# Patient Record
Sex: Female | Born: 1945 | Race: White | Hispanic: No | Marital: Married | Smoking: Never smoker
Health system: Southern US, Community
[De-identification: ages and names within clinical notes are randomized; demographics above are authoritative.]

## PROBLEM LIST (undated history)

## (undated) DIAGNOSIS — I447 Left bundle-branch block, unspecified: Secondary | ICD-10-CM

## (undated) DIAGNOSIS — C801 Malignant (primary) neoplasm, unspecified: Secondary | ICD-10-CM

## (undated) DIAGNOSIS — M1990 Unspecified osteoarthritis, unspecified site: Secondary | ICD-10-CM

## (undated) DIAGNOSIS — E785 Hyperlipidemia, unspecified: Secondary | ICD-10-CM

## (undated) DIAGNOSIS — I10 Essential (primary) hypertension: Secondary | ICD-10-CM

## (undated) DIAGNOSIS — C50919 Malignant neoplasm of unspecified site of unspecified female breast: Secondary | ICD-10-CM

## (undated) DIAGNOSIS — M109 Gout, unspecified: Secondary | ICD-10-CM

## (undated) DIAGNOSIS — Z923 Personal history of irradiation: Secondary | ICD-10-CM

## (undated) DIAGNOSIS — M810 Age-related osteoporosis without current pathological fracture: Secondary | ICD-10-CM

## (undated) DIAGNOSIS — Z853 Personal history of malignant neoplasm of breast: Secondary | ICD-10-CM

## (undated) DIAGNOSIS — K5790 Diverticulosis of intestine, part unspecified, without perforation or abscess without bleeding: Secondary | ICD-10-CM

## (undated) DIAGNOSIS — K219 Gastro-esophageal reflux disease without esophagitis: Secondary | ICD-10-CM

## (undated) DIAGNOSIS — N189 Chronic kidney disease, unspecified: Secondary | ICD-10-CM

## (undated) HISTORY — DX: Gastro-esophageal reflux disease without esophagitis: K21.9

## (undated) HISTORY — DX: Essential (primary) hypertension: I10

## (undated) HISTORY — DX: Chronic kidney disease, unspecified: N18.9

## (undated) HISTORY — DX: Gout, unspecified: M10.9

## (undated) HISTORY — DX: Age-related osteoporosis without current pathological fracture: M81.0

## (undated) HISTORY — DX: Unspecified osteoarthritis, unspecified site: M19.90

## (undated) HISTORY — DX: Hyperlipidemia, unspecified: E78.5

## (undated) HISTORY — DX: Left bundle-branch block, unspecified: I44.7

## (undated) HISTORY — DX: Malignant (primary) neoplasm, unspecified: C80.1

## (undated) HISTORY — DX: Personal history of malignant neoplasm of breast: Z85.3

## (undated) HISTORY — DX: Diverticulosis of intestine, part unspecified, without perforation or abscess without bleeding: K57.90

---

## 1994-04-22 DIAGNOSIS — C801 Malignant (primary) neoplasm, unspecified: Secondary | ICD-10-CM

## 1994-04-22 DIAGNOSIS — Z923 Personal history of irradiation: Secondary | ICD-10-CM

## 1994-04-22 DIAGNOSIS — C50919 Malignant neoplasm of unspecified site of unspecified female breast: Secondary | ICD-10-CM

## 1994-04-22 HISTORY — PX: BREAST LUMPECTOMY: SHX2

## 1994-04-22 HISTORY — DX: Malignant neoplasm of unspecified site of unspecified female breast: C50.919

## 1994-04-22 HISTORY — DX: Malignant (primary) neoplasm, unspecified: C80.1

## 1994-04-22 HISTORY — DX: Personal history of irradiation: Z92.3

## 1997-06-28 ENCOUNTER — Ambulatory Visit (HOSPITAL_COMMUNITY): Admission: RE | Admit: 1997-06-28 | Discharge: 1997-06-28 | Payer: Self-pay | Admitting: Hematology and Oncology

## 1997-10-12 ENCOUNTER — Ambulatory Visit (HOSPITAL_COMMUNITY): Admission: RE | Admit: 1997-10-12 | Discharge: 1997-10-12 | Payer: Self-pay | Admitting: Urology

## 1998-08-22 ENCOUNTER — Ambulatory Visit (HOSPITAL_COMMUNITY): Admission: RE | Admit: 1998-08-22 | Discharge: 1998-08-22 | Payer: Self-pay | Admitting: Hematology and Oncology

## 1998-08-22 ENCOUNTER — Encounter: Payer: Self-pay | Admitting: Hematology and Oncology

## 1998-08-28 ENCOUNTER — Encounter (HOSPITAL_BASED_OUTPATIENT_CLINIC_OR_DEPARTMENT_OTHER): Payer: Self-pay | Admitting: General Surgery

## 1998-08-28 ENCOUNTER — Ambulatory Visit (HOSPITAL_COMMUNITY): Admission: RE | Admit: 1998-08-28 | Discharge: 1998-08-28 | Payer: Self-pay | Admitting: General Surgery

## 1999-02-27 ENCOUNTER — Ambulatory Visit (HOSPITAL_COMMUNITY): Admission: RE | Admit: 1999-02-27 | Discharge: 1999-02-27 | Payer: Self-pay | Admitting: Obstetrics and Gynecology

## 1999-02-27 ENCOUNTER — Encounter: Payer: Self-pay | Admitting: Obstetrics and Gynecology

## 1999-05-01 ENCOUNTER — Other Ambulatory Visit: Admission: RE | Admit: 1999-05-01 | Discharge: 1999-05-01 | Payer: Self-pay | Admitting: Obstetrics and Gynecology

## 1999-10-03 ENCOUNTER — Encounter: Payer: Self-pay | Admitting: Oncology

## 1999-10-03 ENCOUNTER — Encounter: Admission: RE | Admit: 1999-10-03 | Discharge: 1999-10-03 | Payer: Self-pay | Admitting: Oncology

## 2000-04-23 ENCOUNTER — Encounter: Admission: RE | Admit: 2000-04-23 | Discharge: 2000-04-23 | Payer: Self-pay | Admitting: Oncology

## 2000-04-23 ENCOUNTER — Encounter: Payer: Self-pay | Admitting: Oncology

## 2000-06-18 ENCOUNTER — Other Ambulatory Visit: Admission: RE | Admit: 2000-06-18 | Discharge: 2000-06-18 | Payer: Self-pay | Admitting: Obstetrics and Gynecology

## 2000-10-08 ENCOUNTER — Encounter: Payer: Self-pay | Admitting: Oncology

## 2000-10-08 ENCOUNTER — Encounter: Admission: RE | Admit: 2000-10-08 | Discharge: 2000-10-08 | Payer: Self-pay | Admitting: Oncology

## 2001-10-06 ENCOUNTER — Encounter: Admission: RE | Admit: 2001-10-06 | Discharge: 2001-10-06 | Payer: Self-pay | Admitting: Oncology

## 2001-10-06 ENCOUNTER — Encounter: Payer: Self-pay | Admitting: Oncology

## 2001-10-09 ENCOUNTER — Encounter: Admission: RE | Admit: 2001-10-09 | Discharge: 2001-10-09 | Payer: Self-pay | Admitting: Oncology

## 2001-10-09 ENCOUNTER — Encounter: Payer: Self-pay | Admitting: Oncology

## 2002-10-26 ENCOUNTER — Encounter: Admission: RE | Admit: 2002-10-26 | Discharge: 2002-10-26 | Payer: Self-pay | Admitting: Oncology

## 2002-10-26 ENCOUNTER — Encounter: Payer: Self-pay | Admitting: Oncology

## 2003-06-27 ENCOUNTER — Other Ambulatory Visit: Admission: RE | Admit: 2003-06-27 | Discharge: 2003-06-27 | Payer: Self-pay | Admitting: Obstetrics and Gynecology

## 2003-10-31 ENCOUNTER — Encounter: Payer: Self-pay | Admitting: Internal Medicine

## 2003-10-31 ENCOUNTER — Encounter: Admission: RE | Admit: 2003-10-31 | Discharge: 2003-10-31 | Payer: Self-pay | Admitting: Obstetrics and Gynecology

## 2003-11-16 ENCOUNTER — Ambulatory Visit (HOSPITAL_COMMUNITY): Admission: RE | Admit: 2003-11-16 | Discharge: 2003-11-16 | Payer: Self-pay | Admitting: Oncology

## 2004-03-06 ENCOUNTER — Ambulatory Visit: Payer: Self-pay | Admitting: Internal Medicine

## 2004-08-20 ENCOUNTER — Other Ambulatory Visit: Admission: RE | Admit: 2004-08-20 | Discharge: 2004-08-20 | Payer: Self-pay | Admitting: *Deleted

## 2004-09-04 ENCOUNTER — Ambulatory Visit: Payer: Self-pay | Admitting: Internal Medicine

## 2004-11-05 ENCOUNTER — Encounter: Admission: RE | Admit: 2004-11-05 | Discharge: 2004-11-05 | Payer: Self-pay | Admitting: Obstetrics and Gynecology

## 2005-02-26 ENCOUNTER — Ambulatory Visit: Payer: Self-pay | Admitting: Internal Medicine

## 2005-03-11 ENCOUNTER — Ambulatory Visit: Payer: Self-pay | Admitting: Internal Medicine

## 2005-04-22 HISTORY — PX: COLONOSCOPY: SHX174

## 2005-05-13 ENCOUNTER — Ambulatory Visit: Payer: Self-pay | Admitting: Gastroenterology

## 2005-05-28 ENCOUNTER — Ambulatory Visit: Payer: Self-pay | Admitting: Gastroenterology

## 2005-05-28 ENCOUNTER — Encounter: Payer: Self-pay | Admitting: Internal Medicine

## 2005-06-10 ENCOUNTER — Ambulatory Visit: Payer: Self-pay | Admitting: Internal Medicine

## 2005-06-17 ENCOUNTER — Ambulatory Visit: Payer: Self-pay | Admitting: Internal Medicine

## 2005-07-30 ENCOUNTER — Ambulatory Visit: Payer: Self-pay | Admitting: Internal Medicine

## 2005-11-12 ENCOUNTER — Encounter: Admission: RE | Admit: 2005-11-12 | Discharge: 2005-11-12 | Payer: Self-pay | Admitting: Obstetrics and Gynecology

## 2005-11-26 LAB — CONVERTED CEMR LAB
Cholesterol: 303 mg/dL
HDL: 68 mg/dL
LDL Cholesterol: 176 mg/dL
Triglycerides: 294 mg/dL
VLDL: 59 mg/dL

## 2005-11-29 ENCOUNTER — Other Ambulatory Visit: Admission: RE | Admit: 2005-11-29 | Discharge: 2005-11-29 | Payer: Self-pay | Admitting: Obstetrics and Gynecology

## 2005-12-17 ENCOUNTER — Ambulatory Visit: Payer: Self-pay | Admitting: Internal Medicine

## 2006-02-18 ENCOUNTER — Ambulatory Visit: Payer: Self-pay | Admitting: Internal Medicine

## 2006-02-25 ENCOUNTER — Ambulatory Visit: Payer: Self-pay | Admitting: Internal Medicine

## 2006-04-08 ENCOUNTER — Ambulatory Visit: Payer: Self-pay | Admitting: Internal Medicine

## 2006-04-29 ENCOUNTER — Ambulatory Visit: Payer: Self-pay | Admitting: Internal Medicine

## 2006-10-21 ENCOUNTER — Ambulatory Visit: Payer: Self-pay | Admitting: Internal Medicine

## 2006-10-28 ENCOUNTER — Ambulatory Visit: Payer: Self-pay | Admitting: Internal Medicine

## 2006-10-31 DIAGNOSIS — I10 Essential (primary) hypertension: Secondary | ICD-10-CM

## 2006-10-31 DIAGNOSIS — Z853 Personal history of malignant neoplasm of breast: Secondary | ICD-10-CM

## 2006-10-31 DIAGNOSIS — M810 Age-related osteoporosis without current pathological fracture: Secondary | ICD-10-CM

## 2006-10-31 DIAGNOSIS — K219 Gastro-esophageal reflux disease without esophagitis: Secondary | ICD-10-CM

## 2006-10-31 DIAGNOSIS — M8580 Other specified disorders of bone density and structure, unspecified site: Secondary | ICD-10-CM

## 2006-10-31 HISTORY — DX: Gastro-esophageal reflux disease without esophagitis: K21.9

## 2006-10-31 HISTORY — DX: Personal history of malignant neoplasm of breast: Z85.3

## 2006-10-31 HISTORY — DX: Essential (primary) hypertension: I10

## 2006-10-31 HISTORY — DX: Age-related osteoporosis without current pathological fracture: M81.0

## 2006-11-03 ENCOUNTER — Ambulatory Visit: Payer: Self-pay

## 2006-11-03 ENCOUNTER — Encounter: Payer: Self-pay | Admitting: Internal Medicine

## 2006-11-10 DIAGNOSIS — E785 Hyperlipidemia, unspecified: Secondary | ICD-10-CM | POA: Insufficient documentation

## 2006-12-02 ENCOUNTER — Ambulatory Visit: Payer: Self-pay | Admitting: Internal Medicine

## 2007-02-03 ENCOUNTER — Other Ambulatory Visit: Admission: RE | Admit: 2007-02-03 | Discharge: 2007-02-03 | Payer: Self-pay | Admitting: Obstetrics & Gynecology

## 2007-02-24 ENCOUNTER — Encounter: Admission: RE | Admit: 2007-02-24 | Discharge: 2007-02-24 | Payer: Self-pay | Admitting: Obstetrics and Gynecology

## 2007-02-24 ENCOUNTER — Encounter: Payer: Self-pay | Admitting: Internal Medicine

## 2007-03-03 ENCOUNTER — Ambulatory Visit: Payer: Self-pay | Admitting: Internal Medicine

## 2007-05-26 ENCOUNTER — Ambulatory Visit: Payer: Self-pay | Admitting: Internal Medicine

## 2007-05-26 LAB — CONVERTED CEMR LAB
ALT: 38 units/L
Triglycerides: 169 mg/dL

## 2007-06-02 ENCOUNTER — Ambulatory Visit: Payer: Self-pay | Admitting: Internal Medicine

## 2007-12-29 ENCOUNTER — Ambulatory Visit: Payer: Self-pay | Admitting: Internal Medicine

## 2007-12-29 LAB — HM COLONOSCOPY

## 2008-04-22 LAB — CONVERTED CEMR LAB: Pap Smear: NORMAL

## 2008-04-26 ENCOUNTER — Encounter: Admission: RE | Admit: 2008-04-26 | Discharge: 2008-04-26 | Payer: Self-pay | Admitting: Obstetrics and Gynecology

## 2008-05-10 ENCOUNTER — Other Ambulatory Visit: Admission: RE | Admit: 2008-05-10 | Discharge: 2008-05-10 | Payer: Self-pay | Admitting: Obstetrics and Gynecology

## 2008-05-31 ENCOUNTER — Encounter: Payer: Self-pay | Admitting: Internal Medicine

## 2008-06-01 ENCOUNTER — Ambulatory Visit: Payer: Self-pay | Admitting: Internal Medicine

## 2008-07-27 ENCOUNTER — Ambulatory Visit: Payer: Self-pay | Admitting: Internal Medicine

## 2009-01-24 ENCOUNTER — Ambulatory Visit: Payer: Self-pay | Admitting: Internal Medicine

## 2009-04-20 ENCOUNTER — Telehealth: Payer: Self-pay | Admitting: Internal Medicine

## 2009-04-27 ENCOUNTER — Encounter: Admission: RE | Admit: 2009-04-27 | Discharge: 2009-04-27 | Payer: Self-pay | Admitting: Internal Medicine

## 2009-04-27 LAB — HM MAMMOGRAPHY: HM Mammogram: NEGATIVE

## 2009-07-25 ENCOUNTER — Ambulatory Visit: Payer: Self-pay | Admitting: Internal Medicine

## 2009-07-25 DIAGNOSIS — I447 Left bundle-branch block, unspecified: Secondary | ICD-10-CM | POA: Insufficient documentation

## 2009-08-15 ENCOUNTER — Encounter (INDEPENDENT_AMBULATORY_CARE_PROVIDER_SITE_OTHER): Payer: Self-pay | Admitting: *Deleted

## 2009-08-15 ENCOUNTER — Ambulatory Visit: Payer: Self-pay | Admitting: Cardiology

## 2009-08-15 ENCOUNTER — Ambulatory Visit: Payer: Self-pay

## 2009-08-15 ENCOUNTER — Ambulatory Visit (HOSPITAL_COMMUNITY): Admission: RE | Admit: 2009-08-15 | Discharge: 2009-08-15 | Payer: Self-pay | Admitting: Internal Medicine

## 2009-08-15 ENCOUNTER — Encounter: Payer: Self-pay | Admitting: Internal Medicine

## 2010-04-30 ENCOUNTER — Encounter
Admission: RE | Admit: 2010-04-30 | Discharge: 2010-04-30 | Payer: Self-pay | Source: Home / Self Care | Attending: Obstetrics and Gynecology | Admitting: Obstetrics and Gynecology

## 2010-05-22 ENCOUNTER — Ambulatory Visit
Admission: RE | Admit: 2010-05-22 | Discharge: 2010-05-22 | Payer: Self-pay | Source: Home / Self Care | Attending: Internal Medicine | Admitting: Internal Medicine

## 2010-05-24 NOTE — Assessment & Plan Note (Signed)
Summary: 6 month rov/njr   Vital Signs:  Patient profile:   65 year old female Weight:      149 pounds Temp:     97.8 degrees F oral Pulse rate:   68 / minute Pulse rhythm:   regular Resp:     12 per minute BP sitting:   124 / 70  (left arm) Cuff size:   regular  Vitals Entered By: Gladis Riffle, RN (July 25, 2009 8:04 AM) CC: 6 month rov--discuss chest discomfort Is Patient Diabetic? No   CC:  6 month rov--discuss chest discomfort.  History of Present Illness:  Follow-Up Visit      This is a 65 year old woman who presents for Follow-up visit.  The patient complains of chest pain, but denies palpitations, syncope, SOB, DOE, and PND.  The patient reports taking meds as prescribed and not monitoring BP.  When questioned about possible medication side effects, the patient notes none.   In regards to chest pain it happened for at least one week but pain was constant--now resolved. Pian radiated to midback. She was able to find one area in back that was sensitive to palpation  All other systems reviewed and were negative except some rhinorrhea, PNDr ongoing for 2 weeks  Preventive Screening-Counseling & Management  Alcohol-Tobacco     Smoking Status: never  Current Problems (verified): 1)  Transaminases, Serum, Elevated  (ICD-790.4) 2)  Hyperlipidemia  (ICD-272.4) 3)  Osteoporosis  (ICD-733.00) 4)  Hypertension  (ICD-401.9) 5)  Gerd  (ICD-530.81) 6)  Breast Cancer, Hx of  (ICD-V10.3)  Current Medications (verified): 1)  Hydrochlorothiazide 25 Mg Tabs (Hydrochlorothiazide) .... Take 1 Tablet By Mouth Every Morning 2)  Lisinopril 40 Mg Tabs (Lisinopril) .... Take 1 Tablet By Mouth Once A Day 3)  Caltrate 600+d 600-400 Mg-Unit  Tabs (Calcium Carbonate-Vitamin D) .... 3 Once Daily 4)  Omeprazole 20 Mg Cpdr (Omeprazole) .... One By Mouth Daily 5)  Premarin 0.625 Mg/gm Crea (Estrogens, Conjugated) .... Use Every Other Day As Directed 6)  Vitamin D (Ergocalciferol) 50000 Unit Caps  (Ergocalciferol) .... One Every Other Week 7)  Vitamin B-12 500 Mcg Tabs (Cyanocobalamin) .... Once Daily  Allergies: 1)  ! Simvastatin (Simvastatin) 2)  ! * Shrimp  Past History:  Past Medical History: Last updated: 12/29/2007 Breast cancer, hx of--1996 GERD Hypertension Osteoporosis Elevated Lipids Elevated LFT's Hyperlipidemia Headache  Past Surgical History: Last updated: 12/29/2007 Lumpectomy---followed by raditation therapy and tamoxifen for 5 years.  Colonoscopy-05/28/2005  Family History: Last updated: 06/02/2007 Family History of Arthritis Family History Breast cancer 1st degree relative <50 Family History Diabetes 1st degree relative Family History Hypertension Family History Kidney disease Family History of Cardiovascular disorder-mother in her 70s--stroke  Social History: Last updated: 12/01/2006 Never Smoked Married Alcohol use-no Drug use-no Regular exercise-no  Risk Factors: Exercise: no (12/01/2006)  Risk Factors: Smoking Status: never (07/25/2009)  Physical Exam  General:  Well-developed,well-nourished,in no acute distress; alert,appropriate and cooperative throughout examination Head:  normocephalic and atraumatic.   Eyes:  pupils equal and pupils round.   Ears:  R ear normal and L ear normal.   Neck:  No deformities, masses, or tenderness noted. Chest Wall:  No deformities, masses, or tenderness noted. Lungs:  normal respiratory effort, no intercostal retractions, and no accessory muscle use.   Heart:  normal rate and regular rhythm.   Abdomen:  Bowel sounds positive,abdomen soft and non-tender without masses, organomegaly or hernias noted. Msk:  No deformity or scoliosis noted of thoracic  or lumbar spine.   Pulses:  R radial normal and L radial normal.   Neurologic:  cranial nerves II-XII intact and gait normal.   Skin:  turgor normal and color normal.   Psych:  normally interactive and good eye contact.     Impression &  Recommendations:  Problem # 1:  HYPERLIPIDEMIA (ICD-272.4) intolerant to statins note HDL Labs Reviewed: SGOT: 28 (05/26/2007)   SGPT: 38 (05/26/2007)   HDL:74 (05/26/2007), 68 (11/26/2005)  LDL:157 (05/26/2007), 176 (11/26/2005)  Chol:265 (05/26/2007), 303 (11/26/2005)  Trig:169 (05/26/2007), 294 (11/26/2005)  Problem # 2:  HYPERTENSION (ICD-401.9) controlled continue current medications  Her updated medication list for this problem includes:    Hydrochlorothiazide 25 Mg Tabs (Hydrochlorothiazide) .Marland Kitchen... Take 1 tablet by mouth every morning    Lisinopril 40 Mg Tabs (Lisinopril) .Marland Kitchen... Take 1 tablet by mouth once a day  BP today: 124/70 Prior BP: 144/78 (01/24/2009)  Labs Reviewed: Chol: 265 (05/26/2007)   HDL: 74 (05/26/2007)   LDL: 157 (05/26/2007)   TG: 169 (05/26/2007)  Problem # 3:  GERD (ICD-530.81) i suspect this was related to chest pain Her updated medication list for this problem includes:    Omeprazole 20 Mg Cpdr (Omeprazole) ..... 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 Take 1 tablet by mouth every morning 2)  Lisinopril 40 Mg Tabs (Lisinopril) .... Take 1 tablet by mouth once a day 3)  Caltrate 600+d 600-400 Mg-unit Tabs (Calcium carbonate-vitamin d) .... 3 once daily 4)  Omeprazole 20 Mg Cpdr (Omeprazole) .... One by mouth daily 5)  Premarin 0.625 Mg/gm Crea (Estrogens, conjugated) .... Use every other day as directed 6)  Vitamin D (ergocalciferol) 50000 Unit Caps (Ergocalciferol) .... One every other week 7)  Vitamin B-12 500 Mcg Tabs (Cyanocobalamin) .... Once daily  Appended Document: 6 month rov/njr take ASA daily

## 2010-05-24 NOTE — Letter (Signed)
Summary: Outpatient Coinsurance Notice   Outpatient Coinsurance Notice   Imported By: Marylou Mccoy 08/18/2009 15:59:53  _____________________________________________________________________  External Attachment:    Type:   Image     Comment:   External Document

## 2010-05-30 NOTE — Assessment & Plan Note (Signed)
Summary: fup/med check/refills/cjr   Vital Signs:  Patient profile:   65 year old female Weight:      150 pounds Temp:     98.4 degrees F oral Pulse rate:   60 / minute Pulse rhythm:   regular BP sitting:   142 / 78  (left arm) Cuff size:   regular  Vitals Entered By: Alfred Levins, CMA (May 22, 2010 12:11 PM)  History of Present Illness:  Follow-Up Visit  htn---tolerating meds well, needs Rx All other systems reviewed and were negative except some rhinorrhea, PNDr ongoing for 2 weeks  Current Medications (verified): 1)  Hydrochlorothiazide 25 Mg Tabs (Hydrochlorothiazide) .... Take 1 Tablet By Mouth Every Morning 2)  Lisinopril 40 Mg Tabs (Lisinopril) .... Take 1 Tablet By Mouth Once A Day 3)  Caltrate 600+d 600-400 Mg-Unit  Tabs (Calcium Carbonate-Vitamin D) .... 3 Once Daily 4)  Omeprazole 20 Mg Cpdr (Omeprazole) .... One By Mouth Daily 5)  Premarin 0.625 Mg/gm Crea (Estrogens, Conjugated) .... Use Every Other Day As Directed 6)  Vitamin D (Ergocalciferol) 50000 Unit Caps (Ergocalciferol) .... One Every Other Week  Allergies (verified): 1)  ! Simvastatin (Simvastatin) 2)  ! * Shrimp  Past History:  Past Medical History: Last updated: 12/29/2007 Breast cancer, hx of--1996 GERD Hypertension Osteoporosis Elevated Lipids Elevated LFT's Hyperlipidemia Headache  Past Surgical History: Last updated: 12/29/2007 Lumpectomy---followed by raditation therapy and tamoxifen for 5 years.  Colonoscopy-05/28/2005  Family History: Last updated: 06/02/2007 Family History of Arthritis Family History Breast cancer 1st degree relative <50 Family History Diabetes 1st degree relative Family History Hypertension Family History Kidney disease Family History of Cardiovascular disorder-mother in her 70s--stroke  Social History: Last updated: 12/01/2006 Never Smoked Married Alcohol use-no Drug use-no Regular exercise-no  Risk Factors: Exercise: no (12/01/2006)  Risk  Factors: Smoking Status: never (07/25/2009)  Physical Exam  General:  alert and well-developed.   Head:  normocephalic and atraumatic.   Neck:  No deformities, masses, or tenderness noted. Chest Wall:  No deformities, masses, or tenderness noted. Abdomen:  Bowel sounds positive,abdomen soft and non-tender without masses, organomegaly or hernias noted.   Impression & Recommendations:  Problem # 1:  HYPERTENSION (ICD-401.9) adequately controlled at home Her updated medication list for this problem includes:    Hydrochlorothiazide 25 Mg Tabs (Hydrochlorothiazide) .Marland Kitchen... Take 1 tablet by mouth every morning    Lisinopril 40 Mg Tabs (Lisinopril) .Marland Kitchen... Take 1 tablet by mouth once a day  BP today: 142/78 Prior BP: 124/70 (07/25/2009)  Labs Reviewed: Chol: 265 (05/26/2007)   HDL: 74 (05/26/2007)   LDL: 157 (05/26/2007)   TG: 169 (05/26/2007)  Complete Medication List: 1)  Hydrochlorothiazide 25 Mg Tabs (Hydrochlorothiazide) .... Take 1 tablet by mouth every morning 2)  Lisinopril 40 Mg Tabs (Lisinopril) .... Take 1 tablet by mouth once a day 3)  Caltrate 600+d 600-400 Mg-unit Tabs (Calcium carbonate-vitamin d) .... 3 once daily 4)  Omeprazole 20 Mg Cpdr (Omeprazole) .... One by mouth daily 5)  Premarin 0.625 Mg/gm Crea (Estrogens, conjugated) .... Use every other day as directed 6)  Vitamin D (ergocalciferol) 50000 Unit Caps (Ergocalciferol) .... One every other week  Patient Instructions: 1)  4 months---wellness exam Prescriptions: OMEPRAZOLE 20 MG CPDR (OMEPRAZOLE) one by mouth daily  #90 Each x 3   Entered and Authorized by:   Birdie Sons MD   Signed by:   Birdie Sons MD on 05/22/2010   Method used:   Faxed to ...       [facility])       [address] [address]       [place], [place]  [address]       Ph: [phone]       Fax: [phone]   RxID:   [id number] LISINOPRIL 40 MG TABS (LISINOPRIL) Take 1 tablet by mouth once a day  #90 x 3   Entered and Authorized by:    Birdie Sons MD   Signed by:   Birdie Sons MD on 05/22/2010   Method used:   Faxed to ...       [facility])       [address] [address]       [place], [place]  [address]       Ph: [phone]       Fax: [phone]   RxID:   [id number] HYDROCHLOROTHIAZIDE 25 MG TABS (HYDROCHLOROTHIAZIDE) Take 1 tablet by mouth every morning  #90 x 3   Entered and Authorized by:   Birdie Sons MD   Signed by:   Birdie Sons MD on 05/22/2010   Method used:   Faxed to ...       [facility])       [address] [address]       [place], [place]  [address]       Ph: [phone]       Fax: [phone]   RxID:   [id number]    Orders Added: 1)  Est. Patient Level III [71696]

## 2010-06-06 ENCOUNTER — Encounter: Payer: Self-pay | Admitting: Family Medicine

## 2010-06-06 ENCOUNTER — Ambulatory Visit (INDEPENDENT_AMBULATORY_CARE_PROVIDER_SITE_OTHER): Payer: Medicare Other | Admitting: Family Medicine

## 2010-06-06 VITALS — BP 160/92 | Temp 98.5°F | Ht 62.0 in | Wt 147.0 lb

## 2010-06-06 DIAGNOSIS — J209 Acute bronchitis, unspecified: Secondary | ICD-10-CM

## 2010-06-06 MED ORDER — AZITHROMYCIN 250 MG PO TABS: ORAL_TABLET | ORAL | Status: AC

## 2010-06-06 NOTE — Patient Instructions (Signed)
Consider mucinex 2 tablets twice daily for cough.

## 2010-06-06 NOTE — Progress Notes (Signed)
  Subjective:    Patient ID: Michele Conley, female    DOB: Nov 12, 1945, 65 y.o.   MRN: [id number]  HPI  Patient seen with onset of illness approximately 2 weeks ago. Presents mostly with cough productive of green to yellow sputum. Low-grade fever up to 100.6 recently but none over the past 48 hours. Date over-the-counter Robitussin cough syrup with little improvement. The patient is nonsmoker. No antibiotic allergies.   Denies any sore throat, nausea, vomiting, or diarrhea. Minimal nasal congestion   Review of Systems  as per history of present illness    Objective:   Physical Exam  the patient is alert and in no distress Oropharynx is moist and clear Eardrums normal Neck supple no adenopathy Chest clear to auscultation Heart regular rhythm and rate       Assessment & Plan:   acute bronchitis. Given duration of symptoms start Zithromax. Try over-the-counter Mucinex 2 tablets twice daily

## 2010-09-07 ENCOUNTER — Encounter: Payer: Self-pay | Admitting: Internal Medicine

## 2010-09-07 ENCOUNTER — Ambulatory Visit (INDEPENDENT_AMBULATORY_CARE_PROVIDER_SITE_OTHER): Payer: Medicare Other | Admitting: Internal Medicine

## 2010-09-07 DIAGNOSIS — E785 Hyperlipidemia, unspecified: Secondary | ICD-10-CM

## 2010-09-07 DIAGNOSIS — Z23 Encounter for immunization: Secondary | ICD-10-CM

## 2010-09-07 DIAGNOSIS — I447 Left bundle-branch block, unspecified: Secondary | ICD-10-CM

## 2010-09-07 DIAGNOSIS — Z0000 Encounter for general adult medical examination without abnormal findings: Secondary | ICD-10-CM

## 2010-09-07 DIAGNOSIS — I10 Essential (primary) hypertension: Secondary | ICD-10-CM

## 2010-09-07 DIAGNOSIS — Z853 Personal history of malignant neoplasm of breast: Secondary | ICD-10-CM

## 2010-09-07 LAB — BASIC METABOLIC PANEL
BUN: 24 mg/dL — ABNORMAL HIGH (ref 6–23)
Chloride: 104 mEq/L (ref 96–112)
Glucose, Bld: 79 mg/dL (ref 70–99)
Potassium: 5 mEq/L (ref 3.5–5.1)
Sodium: 141 mEq/L (ref 135–145)

## 2010-09-07 LAB — LIPID PANEL
Cholesterol: 202 mg/dL — ABNORMAL HIGH (ref 0–200)
Total CHOL/HDL Ratio: 4
VLDL: 53 mg/dL — ABNORMAL HIGH (ref 0.0–40.0)

## 2010-09-07 LAB — CBC WITH DIFFERENTIAL/PLATELET
Eosinophils Absolute: 0.1 10*3/uL (ref 0.0–0.7)
MCHC: 34.2 g/dL (ref 30.0–36.0)
MCV: 89.8 fl (ref 78.0–100.0)
Monocytes Absolute: 0.9 10*3/uL (ref 0.1–1.0)
Neutrophils Relative %: 60.7 % (ref 43.0–77.0)
Platelets: 330 10*3/uL (ref 150.0–400.0)

## 2010-09-07 LAB — HEPATIC FUNCTION PANEL
ALT: 24 U/L (ref 0–35)
AST: 19 U/L (ref 0–37)
Albumin: 3.9 g/dL (ref 3.5–5.2)
Alkaline Phosphatase: 110 U/L (ref 39–117)
Total Bilirubin: 0.5 mg/dL (ref 0.3–1.2)

## 2010-09-07 LAB — LDL CHOLESTEROL, DIRECT: Direct LDL: 126.6 mg/dL

## 2010-09-07 NOTE — Progress Notes (Deleted)
  Subjective:    Patient ID: Michele Conley, female    DOB: [birth year], 65 y.o.   MRN: [id number]  HPI    Review of Systems     Objective:   Physical Exam        Assessment & Plan:

## 2010-09-07 NOTE — Progress Notes (Signed)
  Subjective:    Michele Conley is a 65 y.o. female who presents for a welcome to Medicare exam.  In addition this patient's medical issues are addressed during this visit. See assessment and plan. Cardiac risk factors: advanced age (older than 39 for men, 7 for women).  Activities of Daily Living  In your present state of health, do you have any difficulty performing the following activities?:  Preparing food and eating?: No Bathing yourself: No Getting dressed: No Using the toilet:No Moving around from place to place: No In the past year have you fallen or had a near fall?:No  Current exercise habits: Gym/ health club routine includes cardio.   Dietary issues discussed: healthy diet   Depression Screen (Note: if answer to either of the following is "Yes", then a more complete depression screening is indicated)  Q1: Over the past two weeks, have you felt down, depressed or hopeless?no   Past Medical History  Diagnosis Date  . HYPERTENSION 10/31/2006  . GERD 10/31/2006  . OSTEOPOROSIS 10/31/2006  . BREAST CANCER, HX OF 10/31/2006   Past Surgical History  Procedure Date  . Breast lumpectomy     followed by radiation and tamoxifen X 5 years    reports that she has never smoked. She does not have any smokeless tobacco history on file. Her alcohol and drug histories not on file. family history includes Arthritis in her other; Cancer in her other; Diabetes in her other; Heart disease in her mother; Hypertension in her other; Kidney disease in her other; and Stroke in her mother. Allergies  Allergen Reactions  . Simvastatin     REACTION: nausea    Review of Systems  patient denies chest pain, shortness of breath, orthopnea. Denies lower extremity edema, abdominal pain, change in appetite, change in bowel movements. Patient denies rashes, musculoskeletal complaints. No other specific complaints in a complete review of systems.     Objective:      Blood pressure 130/80,  pulse 64, temperature 97.9 F (36.6 C), temperature source Oral, height 5' 1.75" (1.568 m), weight 141 lb (63.957 kg). Body mass index is 26.00 kg/(m^2).  Well-developed well-nourished female in no acute distress. HEENT exam atraumatic, normocephalic, extraocular muscles are intact. Neck is supple. No jugular venous distention no thyromegaly. Chest clear to auscultation without increased work of breathing. Cardiac exam S1 and S2 are regular. Abdominal exam active bowel sounds, soft, nontender. Extremities no edema. Neurologic exam she is alert without any motor sensory deficits. Gait is normal.   Assessment:    well visit Health maint utd She will check with insurance about shingles vaccine     Plan:     During the course of the visit the patient was educated and counseled about appropriate screening and preventive services including:   Pneumococcal vaccine   Influenza vaccine  Td vaccine

## 2010-09-07 NOTE — Patient Instructions (Signed)
Low-Cholesterol Guidelines   WHAT IS CHOLESTEROL?   Cholesterol is a waxy, fat-like substance found only in animal fats. It is manufactured by the body and is essential to the functioning of the brain and many other organs. When eaten in excess, cholesterol can raise blood cholesterol levels or help form fatty deposits in blood vessel walls.   WHAT IS SATURATED FAT?   Saturated fats occur naturally in all foods of animal origin and in a few vegetable products such as coconut oil, palm or palm kernel oil, and cocoa butter (chocolate). Hydrogenated (hardened) fats such as vegetable shortening are also highly saturated. They are solid at room temperature and can also have the same harmful effects on your circulatory system as cholesterol.   WHAT ARE UNSATURATED FATS?   Unsaturated fats are fats of plant origin and are liquid at room temperature. Monounsaturated fats such as peanut oil, olive oil, and fish oils tend not to raise blood cholesterol levels and may even be beneficial in lowering them when used in place of saturated fats.   Polyunsaturated fats such as safflower, sunflower, corn, soybean, sesame, and canola oils tend not to raise blood cholesterol when used in moderate amounts.   HOW SHOULD I EAT?   Eat more fish and poultry (with no skin, visible fat, or breading). Eat less red meat.   Buy lean meat and trim all visible fat. Eat no more than 6 ounces (total cooked weight) of any meat, fish, or poultry each day.   Limit intake of fried or fatty foods.   Use low-fat dairy products such as skim milk, low- or non-fat yogurt, and low-fat cheese (try evaporated skim milk in your coffee instead of cream or nondairy creamers).   Eat five or more servings of fruits and vegetables each day. Keep some ready to eat in your refrigerator for a healthy snack.   Use heart healthy tub margarine or oils that are polyunsaturated rather than butter or vegetable shortening.   Avoid excessive use of foods known to be high in  cholesterol, such as egg yolk, liver, shellfish, duck, and goose. Limit egg yolks to no more than 4 per week.   Include complex carbohydrate foods such as whole-grain breads and cereals, beans, rice, and pasta. They contribute needed fiber and are very low in fat.   WHAT ELSE CAN I DO?   Remember to check all labels. Many foods contain hidden fats!   Bake, broil, or roast meat rather than frying them. Skip gravies and rich sauces.   Limit intake of rich pastries and desserts that are high in butter, eggs, cream, or shortening.   Choose a frozen fruit sorbet or low- or non-fat frozen yogurt rather than ice cream. (Some sherbets may contain whole milk, so check labels carefully!)   Use a "nonstick" cooking spray or "nonstick" pans.   Chill and skim fat off of meat stock or drippings when making soup or sauces.   Check labels for the words "hydrogenated" or "hardened" fats or oils, and avoid these products.   ARE THERE OTHER TERMS YOU MAY HAVE QUESTIONS ABOUT?   HDL's (high-density lipoproteins) and LDL's (low-density lipoproteins) transport cholesterol in your blood. LDLs carry cholesterol into your cells and HDLs carry it away and dispose of it in the liver, having a protective effect on your circulatory system. High saturated fat, high cholesterol diets may increase the harmful LDL's. Not smoking, participating in aerobic activity, and losing weight if you are overweight may increase the beneficial   HDL's.   Triglycerides (TG) are fatty compounds. Body fat is made up of mostly stored triglycerides. Triglycerides also circulate in the blood stream like cholesterol, and may have a link with heart disease. To lower triglycerides, maintain ideal body weight, limit intake of sugars and alcohol, and eat a low-fat diet.   Document Released: 09/28/2001 Document Re-Released: 04/28/2007   [employer] Patient Information 2011 [employer]

## 2010-09-18 NOTE — Assessment & Plan Note (Signed)
Adequate control. Continue current medications. 

## 2010-09-18 NOTE — Assessment & Plan Note (Signed)
No known recurrence 

## 2010-09-18 NOTE — Assessment & Plan Note (Signed)
I would like to address lipid issues with diet and exercise. I don't think there is any indication for treatment at this time.

## 2011-03-12 ENCOUNTER — Encounter: Payer: Self-pay | Admitting: Internal Medicine

## 2011-03-12 ENCOUNTER — Ambulatory Visit (INDEPENDENT_AMBULATORY_CARE_PROVIDER_SITE_OTHER): Payer: Medicare Other | Admitting: Internal Medicine

## 2011-03-12 VITALS — BP 126/64 | HR 80 | Temp 98.1°F | Ht 62.0 in | Wt 130.0 lb

## 2011-03-12 DIAGNOSIS — Z853 Personal history of malignant neoplasm of breast: Secondary | ICD-10-CM

## 2011-03-12 DIAGNOSIS — I10 Essential (primary) hypertension: Secondary | ICD-10-CM

## 2011-03-12 DIAGNOSIS — E785 Hyperlipidemia, unspecified: Secondary | ICD-10-CM

## 2011-03-12 DIAGNOSIS — Z23 Encounter for immunization: Secondary | ICD-10-CM

## 2011-03-12 MED ORDER — HYDROCHLOROTHIAZIDE 25 MG PO TABS: 25.0000 mg | ORAL_TABLET | Freq: Every day | ORAL | Status: DC

## 2011-03-12 MED ORDER — LISINOPRIL 40 MG PO TABS: 40.0000 mg | ORAL_TABLET | Freq: Every day | ORAL | Status: DC

## 2011-03-12 MED ORDER — OMEPRAZOLE 20 MG PO CPDR: 20.0000 mg | DELAYED_RELEASE_CAPSULE | Freq: Every day | ORAL | Status: DC

## 2011-03-12 MED ORDER — HYDROCORTISONE 2.5 % RE CREA: TOPICAL_CREAM | RECTAL | Status: AC

## 2011-03-12 NOTE — Progress Notes (Signed)
  Subjective:    Patient ID: Michele Conley, female    DOB: 04-Jan-1946, 65 y.o.   MRN: [id number]  HPI  Patient Active Problem List  Diagnoses  . HYPERLIPIDEMIA---unable to tolerate statin. Has lost 20 pounds  . HYPERTENSION--tolerating meds  .   Marland Kitchen GERD--  No sxs on meds  .   Marland Kitchen BREAST CANCER, HX OF--see overview section   Past Medical History  Diagnosis Date  . HYPERTENSION 10/31/2006  . GERD 10/31/2006  . OSTEOPOROSIS 10/31/2006  . BREAST CANCER, HX OF 10/31/2006   Past Surgical History  Procedure Date  . Breast lumpectomy     followed by radiation and tamoxifen X 5 years    reports that she has never smoked. She does not have any smokeless tobacco history on file. Her alcohol and drug histories not on file. family history includes Arthritis in her other; Breast cancer in her maternal grandmother, mother, and sisters; Cancer in her other; Diabetes in her other; Heart disease in her mother; Hypertension in her other; Kidney disease in her other; and Stroke in her mother. Allergies  Allergen Reactions  . Simvastatin     REACTION: nausea     Review of Systems  patient denies chest pain, shortness of breath, orthopnea. Denies lower extremity edema, abdominal pain, change in appetite, change in bowel movements. Patient denies rashes, musculoskeletal complaints. No other specific complaints in a complete review of systems.      Objective:   Physical Exam  Well-developed well-nourished female in no acute distress. HEENT exam atraumatic, normocephalic, extraocular muscles are intact. Neck is supple. No jugular venous distention no thyromegaly. Chest clear to auscultation without increased work of breathing. Cardiac exam S1 and S2 are regular. Abdominal exam active bowel sounds, soft, nontender. Extremities no edema. Neurologic exam she is alert without any motor sensory deficits. Gait is normal.    Assessment & Plan:

## 2011-03-12 NOTE — Assessment & Plan Note (Signed)
No need for treatment Check in 6 months Note purposeful weight loss

## 2011-03-12 NOTE — Assessment & Plan Note (Signed)
Has yearly mammogram/breast exam

## 2011-03-12 NOTE — Assessment & Plan Note (Signed)
Well controlled Continue meds 

## 2011-04-26 ENCOUNTER — Other Ambulatory Visit: Payer: Self-pay | Admitting: Internal Medicine

## 2011-04-26 DIAGNOSIS — Z1231 Encounter for screening mammogram for malignant neoplasm of breast: Secondary | ICD-10-CM

## 2011-04-26 DIAGNOSIS — Z853 Personal history of malignant neoplasm of breast: Secondary | ICD-10-CM

## 2011-05-13 ENCOUNTER — Ambulatory Visit
Admission: RE | Admit: 2011-05-13 | Discharge: 2011-05-13 | Disposition: A | Discharge status: Home / Self Care | Payer: Medicare Other | Source: Ambulatory Visit | Attending: Internal Medicine | Admitting: Internal Medicine

## 2011-05-13 DIAGNOSIS — Z853 Personal history of malignant neoplasm of breast: Secondary | ICD-10-CM

## 2011-05-13 DIAGNOSIS — Z1231 Encounter for screening mammogram for malignant neoplasm of breast: Secondary | ICD-10-CM

## 2011-05-28 DIAGNOSIS — Z0000 Encounter for general adult medical examination without abnormal findings: Secondary | ICD-10-CM | POA: Diagnosis not present

## 2011-05-28 DIAGNOSIS — Z124 Encounter for screening for malignant neoplasm of cervix: Secondary | ICD-10-CM | POA: Diagnosis not present

## 2011-05-28 DIAGNOSIS — Z01419 Encounter for gynecological examination (general) (routine) without abnormal findings: Secondary | ICD-10-CM | POA: Diagnosis not present

## 2011-05-28 DIAGNOSIS — E559 Vitamin D deficiency, unspecified: Secondary | ICD-10-CM | POA: Diagnosis not present

## 2011-09-10 ENCOUNTER — Ambulatory Visit: Payer: Medicare Other | Admitting: Internal Medicine

## 2011-09-17 ENCOUNTER — Encounter: Payer: Self-pay | Admitting: Internal Medicine

## 2011-09-17 ENCOUNTER — Ambulatory Visit (INDEPENDENT_AMBULATORY_CARE_PROVIDER_SITE_OTHER): Payer: Medicare Other | Admitting: Internal Medicine

## 2011-09-17 VITALS — BP 118/62 | HR 90 | Temp 98.8°F | Ht 62.0 in | Wt 133.0 lb

## 2011-09-17 DIAGNOSIS — E785 Hyperlipidemia, unspecified: Secondary | ICD-10-CM

## 2011-09-17 DIAGNOSIS — K219 Gastro-esophageal reflux disease without esophagitis: Secondary | ICD-10-CM

## 2011-09-17 DIAGNOSIS — I10 Essential (primary) hypertension: Secondary | ICD-10-CM | POA: Diagnosis not present

## 2011-09-17 LAB — LIPID PANEL
HDL: 67.5 mg/dL (ref >39.00)
Total CHOL/HDL Ratio: 4
Triglycerides: 118 mg/dL (ref 0.0–149.0)

## 2011-09-17 LAB — BASIC METABOLIC PANEL
CO2: 29 mEq/L (ref 19–32)
Chloride: 102 mEq/L (ref 96–112)
Creatinine, Ser: 0.9 mg/dL (ref 0.4–1.2)
Glucose, Bld: 95 mg/dL (ref 70–99)
Sodium: 139 mEq/L (ref 135–145)

## 2011-09-17 LAB — LDL CHOLESTEROL, DIRECT: Direct LDL: 154.9 mg/dL

## 2011-09-17 NOTE — Assessment & Plan Note (Signed)
No sxs Continue same meds Check cbc today

## 2011-09-17 NOTE — Assessment & Plan Note (Signed)
No treatment currently Needs f/u

## 2011-09-17 NOTE — Assessment & Plan Note (Signed)
Well controlled Continue same meds 

## 2011-09-17 NOTE — Progress Notes (Signed)
Patient ID: Michele Conley, female   DOB: 05/06/1945, 66 y.o.   MRN: [id number]  htn-- tolerating meds  Lipids-- no treamtne- previous reaction to simvastatin  gerd-- tolerating meds  Past Medical History  Diagnosis Date  . HYPERTENSION 10/31/2006  . GERD 10/31/2006  . OSTEOPOROSIS 10/31/2006  . BREAST CANCER, HX OF 10/31/2006    History   Social History  . Marital Status: Married    Spouse Name: N/A    Number of Children: N/A  . Years of Education: N/A   Occupational History  . Not on file.   Social History Main Topics  . Smoking status: Never Smoker   . Smokeless tobacco: Not on file  . Alcohol Use: Not on file  . Drug Use: Not on file  . Sexually Active: Not on file   Other Topics Concern  . Not on file   Social History Narrative  . No narrative on file    Past Surgical History  Procedure Date  . Breast lumpectomy     followed by radiation and tamoxifen X 5 years    Family History  Problem Relation Age of Onset  . Heart disease Mother   . Stroke Mother     CVA mother age 18  . Breast cancer Mother   . Arthritis Other   . Cancer Other     breast < age 59  . Diabetes Other   . Hypertension Other   . Kidney disease Other   . Breast cancer Sister   . Breast cancer Maternal Grandmother   . Breast cancer Sister     Allergies  Allergen Reactions  . Simvastatin     REACTION: nausea    Current Outpatient Prescriptions on File Prior to Visit  Medication Sig Dispense Refill  . Calcium Carbonate-Vitamin D (CALTRATE 600+D) 600-400 MG-UNIT per tablet Take 1 tablet by mouth daily.        Marland Kitchen conjugated estrogens (PREMARIN) vaginal cream Place 1 g vaginally daily.        . ergocalciferol (VITAMIN D2) 50000 UNITS capsule Take 50,000 Units by mouth every 30 (thirty) days.       . hydrochlorothiazide (HYDRODIURIL) 25 MG tablet Take 1 tablet (25 mg total) by mouth daily.  90 tablet  3  . hydrocortisone (ANUSOL-HC) 2.5 % rectal cream Apply rectally 2 times daily  30  g  1  . lisinopril (PRINIVIL,ZESTRIL) 40 MG tablet Take 1 tablet (40 mg total) by mouth daily.  90 tablet  3  . omeprazole (PRILOSEC) 20 MG capsule Take 1 capsule (20 mg total) by mouth daily.  90 capsule  3     patient denies chest pain, shortness of breath, orthopnea. Denies lower extremity edema, abdominal pain, change in appetite, change in bowel movements. Patient denies rashes, musculoskeletal complaints. No other specific complaints in a complete review of systems.   BP 118/62  Pulse 90  Temp(Src) 98.8 F (37.1 C) (Oral)  Ht 5\' 2"  (1.575 m)  Wt 133 lb (60.328 kg)  BMI 24.33 kg/m2  Well-developed well-nourished female in no acute distress. HEENT exam atraumatic, normocephalic, extraocular muscles are intact. Neck is supple. No jugular venous distention no thyromegaly. Chest clear to auscultation without increased work of breathing. Cardiac exam S1 and S2 are regular. Abdominal exam active bowel sounds, soft, nontender. Extremities no edema. Neurologic exam she is alert without any motor sensory deficits. Gait is normal.

## 2012-02-26 ENCOUNTER — Other Ambulatory Visit: Payer: Self-pay | Admitting: Internal Medicine

## 2012-04-07 ENCOUNTER — Other Ambulatory Visit: Payer: Self-pay | Admitting: Internal Medicine

## 2012-04-07 DIAGNOSIS — Z1231 Encounter for screening mammogram for malignant neoplasm of breast: Secondary | ICD-10-CM

## 2012-05-13 ENCOUNTER — Ambulatory Visit
Admission: RE | Admit: 2012-05-13 | Discharge: 2012-05-13 | Disposition: A | Discharge status: Home / Self Care | Payer: Medicare Other | Source: Ambulatory Visit | Attending: Internal Medicine | Admitting: Internal Medicine

## 2012-05-13 DIAGNOSIS — Z1231 Encounter for screening mammogram for malignant neoplasm of breast: Secondary | ICD-10-CM | POA: Diagnosis not present

## 2012-05-14 DIAGNOSIS — H2510 Age-related nuclear cataract, unspecified eye: Secondary | ICD-10-CM | POA: Diagnosis not present

## 2012-05-29 DIAGNOSIS — E559 Vitamin D deficiency, unspecified: Secondary | ICD-10-CM | POA: Diagnosis not present

## 2012-09-16 ENCOUNTER — Ambulatory Visit: Payer: Medicare Other | Admitting: Internal Medicine

## 2012-10-26 ENCOUNTER — Ambulatory Visit: Payer: Medicare Other | Admitting: Internal Medicine

## 2012-11-16 ENCOUNTER — Encounter: Payer: Self-pay | Admitting: Internal Medicine

## 2012-11-16 ENCOUNTER — Ambulatory Visit (INDEPENDENT_AMBULATORY_CARE_PROVIDER_SITE_OTHER): Payer: Medicare Other | Admitting: Internal Medicine

## 2012-11-16 VITALS — BP 132/64 | HR 64 | Temp 98.0°F | Wt 141.0 lb

## 2012-11-16 DIAGNOSIS — M810 Age-related osteoporosis without current pathological fracture: Secondary | ICD-10-CM | POA: Diagnosis not present

## 2012-11-16 DIAGNOSIS — I10 Essential (primary) hypertension: Secondary | ICD-10-CM

## 2012-11-16 DIAGNOSIS — Z853 Personal history of malignant neoplasm of breast: Secondary | ICD-10-CM | POA: Diagnosis not present

## 2012-11-16 DIAGNOSIS — K219 Gastro-esophageal reflux disease without esophagitis: Secondary | ICD-10-CM | POA: Diagnosis not present

## 2012-11-16 DIAGNOSIS — M542 Cervicalgia: Secondary | ICD-10-CM | POA: Diagnosis not present

## 2012-11-16 DIAGNOSIS — E785 Hyperlipidemia, unspecified: Secondary | ICD-10-CM | POA: Diagnosis not present

## 2012-11-16 LAB — BASIC METABOLIC PANEL
Calcium: 9.5 mg/dL (ref 8.4–10.5)
Creatinine, Ser: 1 mg/dL (ref 0.4–1.2)

## 2012-11-16 LAB — CBC WITH DIFFERENTIAL/PLATELET
Basophils Relative: 0.6 % (ref 0.0–3.0)
Eosinophils Relative: 1.3 % (ref 0.0–5.0)
Hemoglobin: 13.6 g/dL (ref 12.0–15.0)
Lymphocytes Relative: 27.2 % (ref 12.0–46.0)
Monocytes Relative: 8.9 % (ref 3.0–12.0)
Neutrophils Relative %: 62 % (ref 43.0–77.0)
RBC: 4.51 Mil/uL (ref 3.87–5.11)
WBC: 7.4 10*3/uL (ref 4.5–10.5)

## 2012-11-16 LAB — HEPATIC FUNCTION PANEL
AST: 21 U/L (ref 0–37)
Alkaline Phosphatase: 99 U/L (ref 39–117)
Bilirubin, Direct: 0 mg/dL (ref 0.0–0.3)

## 2012-11-16 LAB — SEDIMENTATION RATE: Sed Rate: 75 mm/hr — ABNORMAL HIGH (ref 0–22)

## 2012-11-16 LAB — LDL CHOLESTEROL, DIRECT: Direct LDL: 159.6 mg/dL

## 2012-11-16 LAB — LIPID PANEL
HDL: 52.8 mg/dL (ref >39.00)
VLDL: 70 mg/dL — ABNORMAL HIGH (ref 0.0–40.0)

## 2012-11-16 NOTE — Assessment & Plan Note (Signed)
Well controlled. Continue same medications. Check laboratory work today.

## 2012-11-16 NOTE — Assessment & Plan Note (Signed)
Reviewed last DEXA scan. At that time T-scores put her in the range of osteopenia. That was in 2011. Will recheck DEXA scan.

## 2012-11-16 NOTE — Progress Notes (Signed)
Patient comes in for followup. Patient with gastroesophageal reflux disease tolerating PPI without difficulty.  Patient hypertension, tolerating lisinopril and hydrochlorothiazide  Patient with history of breast cancer Patient had a lumpectomy 1996 followed by radiation and tamoxifen for 5 years.  Patient also has a known left bundle branch block.  Reviewed health maintenance. She is not due for any except for Zostavax which he previously has declined.  Reviewed past medical history, family history, social history, medications.  New complaint- 2months ago- had difficulty lifting both arms. Says it was not painful. But since that time she has had some discomfort of back of neck especially when she tries to lift arms above head.  Review of systems: Patient denies any significant complaints. Specifically no chest pain, shortness breath, PND, orthopnea.  Reviewed vital signs. In general she appears well. Chest clear auscultation cardiac exam S1-S2 are regular. Abdominal exam active bowel sounds, soft, nontender   A/p:- neck discomfort and subjective arm weakness- check esr and ekg F/u after lab

## 2012-11-16 NOTE — Assessment & Plan Note (Signed)
No known recurrence 

## 2012-11-16 NOTE — Assessment & Plan Note (Signed)
No symptoms on PPI. Will check laboratory work today.

## 2012-11-16 NOTE — Assessment & Plan Note (Signed)
Intolerant to statins. 

## 2012-11-17 ENCOUNTER — Ambulatory Visit: Payer: Medicare Other | Admitting: Internal Medicine

## 2012-11-18 ENCOUNTER — Ambulatory Visit (INDEPENDENT_AMBULATORY_CARE_PROVIDER_SITE_OTHER)
Admission: RE | Admit: 2012-11-18 | Discharge: 2012-11-18 | Disposition: A | Discharge status: Home / Self Care | Payer: Medicare Other | Source: Ambulatory Visit | Attending: Internal Medicine | Admitting: Internal Medicine

## 2012-11-18 DIAGNOSIS — M810 Age-related osteoporosis without current pathological fracture: Secondary | ICD-10-CM

## 2012-12-28 ENCOUNTER — Other Ambulatory Visit: Payer: Self-pay | Admitting: Internal Medicine

## 2013-01-26 ENCOUNTER — Other Ambulatory Visit (INDEPENDENT_AMBULATORY_CARE_PROVIDER_SITE_OTHER): Payer: Medicare Other

## 2013-01-26 DIAGNOSIS — E785 Hyperlipidemia, unspecified: Secondary | ICD-10-CM | POA: Diagnosis not present

## 2013-01-26 DIAGNOSIS — M542 Cervicalgia: Secondary | ICD-10-CM | POA: Diagnosis not present

## 2013-01-26 LAB — LIPID PANEL: HDL: 52.1 mg/dL (ref >39.00)

## 2013-01-26 LAB — SEDIMENTATION RATE: Sed Rate: 43 mm/hr — ABNORMAL HIGH (ref 0–22)

## 2013-01-26 LAB — LDL CHOLESTEROL, DIRECT: Direct LDL: 178.5 mg/dL

## 2013-02-25 ENCOUNTER — Other Ambulatory Visit: Payer: Self-pay

## 2013-03-09 ENCOUNTER — Other Ambulatory Visit: Payer: Self-pay | Admitting: Internal Medicine

## 2013-05-17 ENCOUNTER — Ambulatory Visit: Payer: Medicare Other | Admitting: Internal Medicine

## 2013-06-15 ENCOUNTER — Ambulatory Visit: Payer: Medicare Other | Admitting: Internal Medicine

## 2013-06-30 ENCOUNTER — Ambulatory Visit: Payer: Medicare Other | Admitting: Internal Medicine

## 2013-07-07 ENCOUNTER — Ambulatory Visit (INDEPENDENT_AMBULATORY_CARE_PROVIDER_SITE_OTHER): Payer: Medicare Other | Admitting: Internal Medicine

## 2013-07-07 ENCOUNTER — Encounter: Payer: Self-pay | Admitting: Internal Medicine

## 2013-07-07 VITALS — BP 122/62 | HR 64 | Temp 97.9°F | Ht 62.0 in | Wt 138.0 lb

## 2013-07-07 DIAGNOSIS — I10 Essential (primary) hypertension: Secondary | ICD-10-CM

## 2013-07-07 NOTE — Progress Notes (Signed)
Pre visit review using our clinic review tool, if applicable. No additional management support is needed unless otherwise documented below in the visit note. 

## 2013-07-07 NOTE — Progress Notes (Signed)
htn- tolerating meds  GERD- no sxs  Reviewed pmh, psh, sochx Reviewed meds Ros: no complaints  BP 122/62  Pulse 64  Temp(Src) 97.9 F (36.6 C) (Oral)  Ht 5\' 2"  (1.575 m)  Wt 138 lb (62.596 kg)  BMI 25.23 kg/m2  Well-developed well-nourished female in no acute distress. HEENT exam atraumatic, normocephalic, extraocular muscles are intact. Neck is supple. No jugular venous distention no thyromegaly. Chest clear to auscultation without increased work of breathing. Cardiac exam S1 and S2 are regular. Abdominal exam active bowel sounds, soft, nontender. Extremities no edema. Neurologic exam she is alert without any motor sensory deficits. Gait is normal.

## 2013-07-12 NOTE — Assessment & Plan Note (Signed)
Well controlled. Continue current meds

## 2013-07-31 ENCOUNTER — Other Ambulatory Visit: Payer: Self-pay | Admitting: Internal Medicine

## 2013-10-05 ENCOUNTER — Telehealth: Payer: Self-pay | Admitting: Internal Medicine

## 2013-10-05 NOTE — Telephone Encounter (Signed)
Pt would like to become new pt to dr kim. Can I sch?

## 2013-10-05 NOTE — Telephone Encounter (Signed)
Next new patient visit with me in 11:15 Monday slot is in 2016. If she wants to wait that long is ok with me. Otherwise may want to see Dr. Yong Channel whom is taking over for Dr. Leanne Chang. Thanks.

## 2013-10-07 ENCOUNTER — Other Ambulatory Visit: Payer: Self-pay

## 2013-10-07 DIAGNOSIS — Z1231 Encounter for screening mammogram for malignant neoplasm of breast: Secondary | ICD-10-CM

## 2013-10-07 NOTE — Telephone Encounter (Signed)
Pt will be sch with dr Retail banker

## 2013-10-19 ENCOUNTER — Ambulatory Visit
Admission: RE | Admit: 2013-10-19 | Discharge: 2013-10-19 | Disposition: A | Discharge status: Home / Self Care | Payer: Medicare Other | Source: Ambulatory Visit

## 2013-10-19 DIAGNOSIS — Z1231 Encounter for screening mammogram for malignant neoplasm of breast: Secondary | ICD-10-CM | POA: Diagnosis not present

## 2014-01-07 ENCOUNTER — Ambulatory Visit: Payer: Medicare Other | Admitting: Internal Medicine

## 2014-01-14 ENCOUNTER — Encounter: Payer: Self-pay | Admitting: Family Medicine

## 2014-01-14 ENCOUNTER — Ambulatory Visit (INDEPENDENT_AMBULATORY_CARE_PROVIDER_SITE_OTHER): Payer: Medicare Other | Admitting: Family Medicine

## 2014-01-14 VITALS — BP 132/70 | HR 60 | Temp 98.2°F | Wt 143.0 lb

## 2014-01-14 DIAGNOSIS — I10 Essential (primary) hypertension: Secondary | ICD-10-CM | POA: Diagnosis not present

## 2014-01-14 DIAGNOSIS — K219 Gastro-esophageal reflux disease without esophagitis: Secondary | ICD-10-CM

## 2014-01-14 DIAGNOSIS — N183 Chronic kidney disease, stage 3 unspecified: Secondary | ICD-10-CM | POA: Insufficient documentation

## 2014-01-14 DIAGNOSIS — N182 Chronic kidney disease, stage 2 (mild): Secondary | ICD-10-CM

## 2014-01-14 DIAGNOSIS — Z23 Encounter for immunization: Secondary | ICD-10-CM | POA: Diagnosis not present

## 2014-01-14 DIAGNOSIS — E785 Hyperlipidemia, unspecified: Secondary | ICD-10-CM | POA: Diagnosis not present

## 2014-01-14 DIAGNOSIS — I5189 Other ill-defined heart diseases: Secondary | ICD-10-CM | POA: Insufficient documentation

## 2014-01-14 MED ORDER — ATORVASTATIN CALCIUM 20 MG PO TABS: ORAL_TABLET | ORAL | Status: DC

## 2014-01-14 MED ORDER — ATORVASTATIN CALCIUM 20 MG PO TABS: 20.0000 mg | ORAL_TABLET | Freq: Every day | ORAL | Status: DC

## 2014-01-14 NOTE — Progress Notes (Signed)
Michele Reddish, MD Phone: [phone]  Subjective:  Patient presents today to establish care with me as their new primary care provider. Patient was formerly a patient of Dr. Leanne Chang. Chief complaint-noted.   Hyperlipidemia-poor control LDL-178 last measure On statin: no difficulty tolerating in past, never tried pulse dosing Regular exercise: walking 2-3x a week 2 miles.  ROS- no chest pain or shortness of breath. No myalgias  Hypertension-controlled  CKD stage II-controlled BP Readings from Last 3 Encounters:  01/14/14 132/70  07/07/13 122/62  11/16/12 132/64  Taking ace-i.  Home BP monitoring-no Compliant with medications-yes without side effects ROS-Denies any CP, HA, SOB, blurry vision, LE edema.    GERD-controlled On omeprazole ROS-no burping, no chest discomfort  The following were reviewed and entered/updated in epic: Past Medical History  Diagnosis Date  . HYPERTENSION 10/31/2006  . GERD 10/31/2006  . OSTEOPOROSIS 10/31/2006  . BREAST CANCER, HX OF 10/31/2006   Patient Active Problem List   Diagnosis Date Noted  . Diastolic dysfunction 43/15/4008    Priority: Medium  . CKD (chronic kidney disease), stage II 01/14/2014    Priority: Medium  . HYPERLIPIDEMIA 11/10/2006    Priority: Medium  . HYPERTENSION 10/31/2006    Priority: Medium  . BREAST CANCER, HX OF 10/31/2006    Priority: Medium  . LEFT BUNDLE BRANCH BLOCK 07/25/2009    Priority: Low  . GERD 10/31/2006    Priority: Low  . Osteopenia 10/31/2006    Priority: Low   Past Surgical History  Procedure Laterality Date  . Breast lumpectomy      followed by radiation and tamoxifen X 5 years    Family History  Problem Relation Age of Onset  . Heart disease Mother   . Stroke Mother     CVA mother age 41  . Breast cancer Mother   . Arthritis Other   . Cancer Other     breast < age 60  . Diabetes Other   . Hypertension Other   . Kidney disease Other   . Breast cancer Sister   . Breast cancer  Maternal Grandmother   . Breast cancer Sister     x2    Medications- reviewed and updated Current Outpatient Prescriptions  Medication Sig Dispense Refill  . Calcium Carbonate-Vitamin D (CALTRATE 600+D) 600-400 MG-UNIT per tablet Take 1 tablet by mouth daily.        . hydrochlorothiazide (HYDRODIURIL) 25 MG tablet TAKE 1 TABLET DAILY  90 tablet  3  . lisinopril (PRINIVIL,ZESTRIL) 40 MG tablet TAKE 1 TABLET DAILY  90 tablet  3  . omeprazole (PRILOSEC) 20 MG capsule TAKE 1 CAPSULE DAILY  90 capsule  3  . atorvastatin (LIPITOR) 20 MG tablet Take one pill once a week  30 tablet  3   No current facility-administered medications for this visit.    Allergies-reviewed and updated Allergies  Allergen Reactions  . Simvastatin     REACTION: nausea    History   Social History  . Marital Status: Married    Spouse Name: N/A    Number of Children: N/A  . Years of Education: N/A   Social History Main Topics  . Smoking status: Never Smoker   . Smokeless tobacco: None  . Alcohol Use: Yes     Comment: very rare  . Drug Use: No  . Sexual Activity: None   Other Topics Concern  . None   Social History Narrative   Married 1969. 2 sons Nathaneil Canary in [place] in [place]. 5 grandkids, 1 greatgranddaughter, 1 great step grandson.       Retired from 44 years with [employer]      Hobbies: travel/sightseeing, time at [employer], [place] with friends. [employer] in [place]    ROS--See HPI   Objective: BP 132/70  Pulse 60  Temp(Src) 98.2 F (36.8 C)  Wt 143 lb (64.864 kg) Gen: NAD, resting comfortably in chair, climbs to table easily HEENT: Mucous membranes are moist. Oropharynx normal. Good dentition- a few caps on teeth Neck: no thyromegaly CV: RRR no murmurs rubs or gallops Lungs: CTAB no crackles, wheeze, rhonchi Abdomen: soft/nontender/nondistended/normal bowel sounds. Ext: no edema Skin: warm, dry, no rash Neuro: grossly normal, moves all extremities,  PERRLA  Assessment/Plan:  HYPERTENSION Well controlled. Continue lisinopril 40mg  and hctz 25mg .   HYPERLIPIDEMIA Poorly controlled with last ldl 178. Trial once weekly atorvastatin. Check lipids in 7-8 weeks.   GERD Well controlled. Continue omeprazole.   CKD (chronic kidney disease), stage II Last GFR barely below 60. Suspect still stage II though. Avoid nsaids. Check bmet with labs.    Future fasting labs Orders Placed This Encounter  Procedures  . CBC    [place]    Standing Status: Future     Number of Occurrences:      Standing Expiration Date: 03/21/2014  . Comprehensive metabolic panel    [place]    Standing Status: Future     Number of Occurrences:      Standing Expiration Date: 03/21/2014  . Lipid panel    [place]    Standing Status: Future     Number of Occurrences:      Standing Expiration Date: 03/21/2014  . TSH        Standing Status: Future     Number of Occurrences:      Standing Expiration Date: 03/21/2014    Meds ordered this encounter  Medications  . DISCONTD: atorvastatin (LIPITOR) 20 MG tablet    Sig: Take 1 tablet (20 mg total) by mouth daily at 6 PM.    Dispense:  30 tablet    Refill:  3  . atorvastatin (LIPITOR) 20 MG tablet    Sig: Take one pill once a week    Dispense:  30 tablet    Refill:  3

## 2014-01-14 NOTE — Assessment & Plan Note (Signed)
Last GFR barely below 60. Suspect still stage II though. Avoid nsaids. Check bmet with labs.

## 2014-01-14 NOTE — Assessment & Plan Note (Signed)
Poorly controlled with last ldl 178. Trial once weekly atorvastatin. Check lipids in 7-8 weeks.

## 2014-01-14 NOTE — Patient Instructions (Addendum)
Come back in 7-8 weeks for fasting labs. (let me know before then if you cannot tolerate the medicine-I do not expect any problems with this low dose)  Cholesterol-try atorvastatin once a week-sent in a daily on accident first. There should be one that says once a week.  Could try coenzyme q10 if had muscle aches.   Left bundle branch block-you have this at baseline so let someone know if you ever have an EKG.

## 2014-01-14 NOTE — Assessment & Plan Note (Signed)
Well-controlled.  Continue omeprazole. 

## 2014-01-14 NOTE — Assessment & Plan Note (Signed)
Well controlled. Continue lisinopril 40mg  and hctz 25mg .

## 2014-02-25 ENCOUNTER — Other Ambulatory Visit (INDEPENDENT_AMBULATORY_CARE_PROVIDER_SITE_OTHER): Payer: Medicare Other

## 2014-02-25 DIAGNOSIS — I10 Essential (primary) hypertension: Secondary | ICD-10-CM

## 2014-02-25 DIAGNOSIS — E785 Hyperlipidemia, unspecified: Secondary | ICD-10-CM | POA: Diagnosis not present

## 2014-02-25 LAB — CBC
HCT: 39.5 % (ref 36.0–46.0)
Hemoglobin: 13.2 g/dL (ref 12.0–15.0)
MCHC: 33.4 g/dL (ref 30.0–36.0)
MCV: 88 fl (ref 78.0–100.0)
Platelets: 299 10*3/uL (ref 150.0–400.0)
RBC: 4.48 Mil/uL (ref 3.87–5.11)
RDW: 13.6 % (ref 11.5–15.5)
WBC: 7.3 10*3/uL (ref 4.0–10.5)

## 2014-02-25 LAB — COMPREHENSIVE METABOLIC PANEL
ALK PHOS: 92 U/L (ref 39–117)
ALT: 19 U/L (ref 0–35)
AST: 17 U/L (ref 0–37)
Albumin: 3.2 g/dL — ABNORMAL LOW (ref 3.5–5.2)
BUN: 20 mg/dL (ref 6–23)
CALCIUM: 9.2 mg/dL (ref 8.4–10.5)
CO2: 23 mEq/L (ref 19–32)
Chloride: 103 mEq/L (ref 96–112)
Creatinine, Ser: 1.1 mg/dL (ref 0.4–1.2)
GFR: 53.5 mL/min — ABNORMAL LOW (ref >60.00)
GLUCOSE: 95 mg/dL (ref 70–99)
POTASSIUM: 3.7 meq/L (ref 3.5–5.1)
SODIUM: 139 meq/L (ref 135–145)
TOTAL PROTEIN: 7.1 g/dL (ref 6.0–8.3)
Total Bilirubin: 0.6 mg/dL (ref 0.2–1.2)

## 2014-02-25 LAB — LDL CHOLESTEROL, DIRECT: Direct LDL: 122.6 mg/dL

## 2014-02-25 LAB — LIPID PANEL
Cholesterol: 224 mg/dL — ABNORMAL HIGH (ref 0–200)
HDL: 49.5 mg/dL (ref >39.00)
NonHDL: 174.5
TRIGLYCERIDES: 222 mg/dL — AB (ref 0.0–149.0)
Total CHOL/HDL Ratio: 5
VLDL: 44.4 mg/dL — ABNORMAL HIGH (ref 0.0–40.0)

## 2014-02-25 LAB — TSH: TSH: 1.41 u[IU]/mL (ref 0.35–4.50)

## 2014-06-19 ENCOUNTER — Other Ambulatory Visit: Payer: Self-pay | Admitting: Internal Medicine

## 2014-07-15 ENCOUNTER — Ambulatory Visit: Payer: Medicare Other | Admitting: Family Medicine

## 2014-07-18 ENCOUNTER — Ambulatory Visit (INDEPENDENT_AMBULATORY_CARE_PROVIDER_SITE_OTHER): Payer: Medicare Other | Admitting: Family Medicine

## 2014-07-18 VITALS — BP 132/72 | HR 71 | Temp 98.3°F | Wt 146.0 lb

## 2014-07-18 DIAGNOSIS — M8580 Other specified disorders of bone density and structure, unspecified site: Secondary | ICD-10-CM

## 2014-07-18 DIAGNOSIS — Z23 Encounter for immunization: Secondary | ICD-10-CM

## 2014-07-18 DIAGNOSIS — K649 Unspecified hemorrhoids: Secondary | ICD-10-CM

## 2014-07-18 DIAGNOSIS — E785 Hyperlipidemia, unspecified: Secondary | ICD-10-CM | POA: Diagnosis not present

## 2014-07-18 DIAGNOSIS — E559 Vitamin D deficiency, unspecified: Secondary | ICD-10-CM

## 2014-07-18 LAB — VITAMIN D 25 HYDROXY (VIT D DEFICIENCY, FRACTURES): VITD: 23.18 ng/mL — ABNORMAL LOW (ref 30.00–100.00)

## 2014-07-18 LAB — LDL CHOLESTEROL, DIRECT: Direct LDL: 80 mg/dL

## 2014-07-18 MED ORDER — HYDROCORTISONE 2.5 % RE CREA: 1.0000 "application " | TOPICAL_CREAM | Freq: Two times a day (BID) | RECTAL | Status: DC

## 2014-07-18 MED ORDER — CHOLECALCIFEROL 1.25 MG (50000 UT) PO CAPS: 50000.0000 [IU] | ORAL_CAPSULE | ORAL | Status: DC

## 2014-07-18 NOTE — Assessment & Plan Note (Signed)
Intermittent issue. Prescribed rectal steroids.

## 2014-07-18 NOTE — Progress Notes (Signed)
Garret Reddish, MD Phone: [phone]  Subjective:   YOUSRA Conley is a 69 y.o. year old very pleasant female patient who presents with the following:  Hyperlipidemia-improved control with LDL from  178 to 122 on once weekly atorvastatin.  ROS- no chest pain or shortness of breath. No myalgias  Vitamin D deficiency- suspect poor control as off medication 6 months and not taking daily supplement Osteopenia- stable on last DEXA -history of extremely low levels. Was on 50k units weekly in winter, every other week in summer and ran out 6 months ago. Formerly prescribed by [facility]. No recent available levels. Requests refill of weekly 50k units.  ROS- no recent fractures, no bone pain  Hemorrhoids Uses proctosol hc 2.5% cream when hemorrhoids flare. Occasional/rare bleeding with. Twice a year bothers her. No current flare. But she knows she was running low on cream.   Past Medical History- Patient Active Problem List   Diagnosis Date Noted  . Diastolic dysfunction 86/57/8469    Priority: Medium  . CKD (chronic kidney disease), stage II 01/14/2014    Priority: Medium  . Hyperlipidemia 11/10/2006    Priority: Medium  . HYPERTENSION 10/31/2006    Priority: Medium  . BREAST CANCER, HX OF 10/31/2006    Priority: Medium  . Vitamin D deficiency 07/18/2014    Priority: Low  . Hemorrhoids 07/18/2014    Priority: Low  . LEFT BUNDLE BRANCH BLOCK 07/25/2009    Priority: Low  . GERD 10/31/2006    Priority: Low  . Osteopenia 10/31/2006    Priority: Low   Medications- reviewed and updated Current Outpatient Prescriptions  Medication Sig Dispense Refill  . atorvastatin (LIPITOR) 20 MG tablet Take one pill once a week 30 tablet 3  . Calcium Carbonate-Vitamin D (CALTRATE 600+D) 600-400 MG-UNIT per tablet Take 1 tablet by mouth daily.      . hydrochlorothiazide (HYDRODIURIL) 25 MG tablet TAKE 1 TABLET DAILY 90 tablet 2  . lisinopril (PRINIVIL,ZESTRIL) 40 MG tablet TAKE 1  TABLET DAILY 90 tablet 2  . omeprazole (PRILOSEC) 20 MG capsule TAKE 1 CAPSULE DAILY 90 capsule 2   Objective: BP 132/72 mmHg  Pulse 71  Temp(Src) 98.3 F (36.8 C)  Wt 146 lb (66.225 kg) Gen: NAD, resting comfortably in chair CV: RRR no murmurs rubs or gallops Lungs: CTAB no crackles, wheeze, rhonchi Abdomen: soft/nontender/nondistended/normal bowel sounds. No rebound or guarding.  Ext: no edema Skin: warm, dry, no rash  Neuro: grossly normal, moves all extremities, normal gait   Assessment/Plan:  Hyperlipidemia Controlled with atorvastatin 20 mg weekly with LDL less than 100.    Vitamin D deficiency Vitamin D between 68 and 78. Replace with 50,000 units weekly for 12 weeks then maintenance with 2000 international units daily. Recheck 6-12 months   Osteopenia Stable in 2014. We will focus on replenishing vitamin D given she is a higher-risk patient. I did not advocate strongly for calcium.    Hemorrhoids Intermittent issue. Prescribed rectal steroids.    start daily aspirin. 6 month follow-up.  Orders Placed This Encounter  Procedures  . Pneumococcal conjugate vaccine 13-valent  . LDL cholesterol, direct    [place]  . Vit D  25 hydroxy (rtn osteoporosis monitoring)    [place]    Meds ordered this encounter  Medications  . hydrocortisone (PROCTOSOL HC) 2.5 % rectal cream    Sig: Place 1 application rectally 2 (two) times daily.    Dispense:  30 g    Refill:  0  .  Cholecalciferol 50000 UNITS capsule    Sig: Take 1 capsule (50,000 Units total) by mouth once a week.    Dispense:  12 capsule    Refill:  0

## 2014-07-18 NOTE — Patient Instructions (Addendum)
Pt received final Pneumonia shot today ([id number])  Refilled hemorrhoid cream  Take aspirin 81mg  every other day  Continue once a week atorvastatin 20mg   Check vitamin D- may use 12 weeks of vitamin d 50,000 units

## 2014-07-18 NOTE — Assessment & Plan Note (Signed)
Vitamin D between 20 and 30. Replace with 50,000 units weekly for 12 weeks then maintenance with 2000 international units daily. Recheck 6-12 months

## 2014-07-18 NOTE — Assessment & Plan Note (Signed)
Stable in 2014. We will focus on replenishing vitamin D given she is a higher-risk patient. I did not advocate strongly for calcium.

## 2014-07-18 NOTE — Assessment & Plan Note (Signed)
Controlled with atorvastatin 20 mg weekly with LDL less than 100.

## 2014-11-01 DIAGNOSIS — H2513 Age-related nuclear cataract, bilateral: Secondary | ICD-10-CM | POA: Diagnosis not present

## 2014-12-02 ENCOUNTER — Other Ambulatory Visit: Payer: Self-pay

## 2014-12-02 DIAGNOSIS — Z1231 Encounter for screening mammogram for malignant neoplasm of breast: Secondary | ICD-10-CM

## 2015-01-16 ENCOUNTER — Ambulatory Visit
Admission: RE | Admit: 2015-01-16 | Discharge: 2015-01-16 | Disposition: A | Discharge status: Home / Self Care | Payer: Medicare Other | Source: Ambulatory Visit

## 2015-01-16 DIAGNOSIS — Z1231 Encounter for screening mammogram for malignant neoplasm of breast: Secondary | ICD-10-CM

## 2015-01-18 ENCOUNTER — Ambulatory Visit: Payer: Medicare Other | Admitting: Family Medicine

## 2015-01-30 ENCOUNTER — Ambulatory Visit (INDEPENDENT_AMBULATORY_CARE_PROVIDER_SITE_OTHER): Payer: Medicare Other | Admitting: Family Medicine

## 2015-01-30 ENCOUNTER — Encounter: Payer: Self-pay | Admitting: Family Medicine

## 2015-01-30 VITALS — BP 138/82 | HR 64 | Temp 98.1°F | Wt 142.0 lb

## 2015-01-30 DIAGNOSIS — I10 Essential (primary) hypertension: Secondary | ICD-10-CM

## 2015-01-30 DIAGNOSIS — Z23 Encounter for immunization: Secondary | ICD-10-CM | POA: Diagnosis not present

## 2015-01-30 DIAGNOSIS — N182 Chronic kidney disease, stage 2 (mild): Secondary | ICD-10-CM | POA: Diagnosis not present

## 2015-01-30 DIAGNOSIS — E559 Vitamin D deficiency, unspecified: Secondary | ICD-10-CM

## 2015-01-30 LAB — BASIC METABOLIC PANEL
BUN: 17 mg/dL (ref 6–23)
CO2: 31 meq/L (ref 19–32)
Calcium: 9.5 mg/dL (ref 8.4–10.5)
Chloride: 101 mEq/L (ref 96–112)
Creatinine, Ser: 0.96 mg/dL (ref 0.40–1.20)
GFR: 61.13 mL/min (ref >60.00)
GLUCOSE: 84 mg/dL (ref 70–99)
POTASSIUM: 4.8 meq/L (ref 3.5–5.1)
SODIUM: 141 meq/L (ref 135–145)

## 2015-01-30 LAB — VITAMIN D 25 HYDROXY (VIT D DEFICIENCY, FRACTURES): VITD: 34.21 ng/mL (ref 30.00–100.00)

## 2015-01-30 NOTE — Assessment & Plan Note (Signed)
S: last vitamin D around 23. 12 weeks 50k units were taken. Now on Vit D- 2000 maintenance A/P: recheck Vitamin D today, goal >30.

## 2015-01-30 NOTE — Assessment & Plan Note (Signed)
S: 2 subsequent GFR just under 60. Avoids nsaids. BP controlled. Compliant with BP meds and statin A/P: check GFR today, monitor every 6-12 months as long as >45

## 2015-01-30 NOTE — Progress Notes (Signed)
Garret Reddish, MD  Subjective:  Michele Conley is a 69 y.o. year old very pleasant female patient who presents for/with See problem oriented charting ROS- no chest pain or shortness of breath or dyspnea on exertion. No headache or blurry vision. No change in urination pattern. No hematuria.   Past Medical History-  Patient Active Problem List   Diagnosis Date Noted  . Diastolic dysfunction 24/12/7351    Priority: Medium  . CKD (chronic kidney disease), stage III 01/14/2014    Priority: Medium  . Hyperlipidemia 11/10/2006    Priority: Medium  . Essential hypertension 10/31/2006    Priority: Medium  . BREAST CANCER, HX OF 10/31/2006    Priority: Medium  . Vitamin D deficiency 07/18/2014    Priority: Low  . Hemorrhoids 07/18/2014    Priority: Low  . LEFT BUNDLE BRANCH BLOCK 07/25/2009    Priority: Low  . GERD 10/31/2006    Priority: Low  . Osteopenia 10/31/2006    Priority: Low    Medications- reviewed and updated Current Outpatient Prescriptions  Medication Sig Dispense Refill  . atorvastatin (LIPITOR) 20 MG tablet Take one pill once a week 30 tablet 3  . Calcium Carbonate-Vitamin D (CALTRATE 600+D) 600-400 MG-UNIT per tablet Take 1 tablet by mouth daily.      . Cholecalciferol 50000 UNITS capsule Take 1 capsule (50,000 Units total) by mouth once a week. 12 capsule 0  . hydrochlorothiazide (HYDRODIURIL) 25 MG tablet TAKE 1 TABLET DAILY 90 tablet 2  . hydrocortisone (PROCTOSOL HC) 2.5 % rectal cream Place 1 application rectally 2 (two) times daily. 30 g 0  . lisinopril (PRINIVIL,ZESTRIL) 40 MG tablet TAKE 1 TABLET DAILY 90 tablet 2  . omeprazole (PRILOSEC) 20 MG capsule TAKE 1 CAPSULE DAILY 90 capsule 2   No current facility-administered medications for this visit.    Objective: BP 138/82 mmHg  Pulse 64  Temp(Src) 98.1 F (36.7 C)  Wt 142 lb (64.411 kg) Gen: NAD, resting comfortably CV: RRR no murmurs rubs or gallops Lungs: CTAB no crackles, wheeze,  rhonchi Abdomen: soft/nontender/nondistended/normal bowel sounds. No rebound or guarding.  Ext: no edema Skin: warm, dry Neuro: grossly normal, moves all extremities  Assessment/Plan:  Vitamin D deficiency S: last vitamin D around 23. 12 weeks 50k units were taken. Now on Vit D- 2000 maintenance A/P: recheck Vitamin D today, goal >30.    Essential hypertension S: controlled on Lisinopril 40mg  and hctz 25 mg  BP Readings from Last 3 Encounters:  01/30/15 138/82  07/18/14 132/72  01/14/14 132/70  A/P: continue current rx   CKD (chronic kidney disease), stage III S: 2 subsequent GFR just under 60. Avoids nsaids. BP controlled. Compliant with BP meds and statin A/P: check GFR today, monitor every 6-12 months as long as >45   6 month AWV. Return precautions advised.   Orders Placed This Encounter  Procedures  . Flu Vaccine QUAD 36+ mos IM  . Basic metabolic panel    [place]  . Vit D  25 hydroxy (rtn osteoporosis monitoring)    

## 2015-01-30 NOTE — Patient Instructions (Addendum)
Flu shot received today.  Check vitamin D. Continue 2000 IU daily of vitamin D  Blood pressure at goal  Cholesterol controlled last visit  Make sure kidney function stable  Annual wellness visit in 6 months (will be some extra paperwork so try to arrive at least 10-15 minutes early if you can)

## 2015-01-30 NOTE — Assessment & Plan Note (Signed)
S: controlled on Lisinopril 40mg  and hctz 25 mg  BP Readings from Last 3 Encounters:  01/30/15 138/82  07/18/14 132/72  01/14/14 132/70  A/P: continue current rx

## 2015-03-15 ENCOUNTER — Other Ambulatory Visit: Payer: Self-pay | Admitting: Family Medicine

## 2015-06-16 ENCOUNTER — Encounter: Payer: Self-pay | Admitting: Gastroenterology

## 2015-08-01 ENCOUNTER — Encounter: Payer: Medicare Other | Admitting: Family Medicine

## 2015-09-09 ENCOUNTER — Other Ambulatory Visit: Payer: Self-pay | Admitting: Family Medicine

## 2015-09-11 ENCOUNTER — Other Ambulatory Visit: Payer: Self-pay

## 2015-10-20 ENCOUNTER — Encounter: Payer: Self-pay | Admitting: Family Medicine

## 2015-10-20 ENCOUNTER — Ambulatory Visit (INDEPENDENT_AMBULATORY_CARE_PROVIDER_SITE_OTHER): Payer: Medicare Other | Admitting: Family Medicine

## 2015-10-20 VITALS — BP 138/72 | HR 60 | Temp 97.8°F | Ht 62.5 in | Wt 137.0 lb

## 2015-10-20 DIAGNOSIS — N183 Chronic kidney disease, stage 3 unspecified: Secondary | ICD-10-CM

## 2015-10-20 DIAGNOSIS — Z0000 Encounter for general adult medical examination without abnormal findings: Secondary | ICD-10-CM

## 2015-10-20 DIAGNOSIS — K219 Gastro-esophageal reflux disease without esophagitis: Secondary | ICD-10-CM | POA: Diagnosis not present

## 2015-10-20 DIAGNOSIS — E785 Hyperlipidemia, unspecified: Secondary | ICD-10-CM | POA: Diagnosis not present

## 2015-10-20 DIAGNOSIS — I10 Essential (primary) hypertension: Secondary | ICD-10-CM

## 2015-10-20 DIAGNOSIS — E559 Vitamin D deficiency, unspecified: Secondary | ICD-10-CM | POA: Diagnosis not present

## 2015-10-20 LAB — COMPREHENSIVE METABOLIC PANEL
ALBUMIN: 4.2 g/dL (ref 3.5–5.2)
ALT: 17 U/L (ref 0–35)
AST: 16 U/L (ref 0–37)
Alkaline Phosphatase: 110 U/L (ref 39–117)
BUN: 21 mg/dL (ref 6–23)
CALCIUM: 9.6 mg/dL (ref 8.4–10.5)
CHLORIDE: 104 meq/L (ref 96–112)
CO2: 29 mEq/L (ref 19–32)
Creatinine, Ser: 1.04 mg/dL (ref 0.40–1.20)
GFR: 55.62 mL/min — AB (ref >60.00)
Glucose, Bld: 106 mg/dL — ABNORMAL HIGH (ref 70–99)
POTASSIUM: 4 meq/L (ref 3.5–5.1)
SODIUM: 140 meq/L (ref 135–145)
Total Bilirubin: 0.5 mg/dL (ref 0.2–1.2)
Total Protein: 7.1 g/dL (ref 6.0–8.3)

## 2015-10-20 LAB — POC URINALSYSI DIPSTICK (AUTOMATED)
BILIRUBIN UA: NEGATIVE
Blood, UA: NEGATIVE
GLUCOSE UA: NEGATIVE
KETONES UA: NEGATIVE
Nitrite, UA: NEGATIVE
Protein, UA: NEGATIVE
SPEC GRAV UA: 1.015
Urobilinogen, UA: 0.2
pH, UA: 5.5

## 2015-10-20 LAB — CBC
HEMATOCRIT: 39 % (ref 36.0–46.0)
HEMOGLOBIN: 13.2 g/dL (ref 12.0–15.0)
MCHC: 33.8 g/dL (ref 30.0–36.0)
MCV: 88.2 fl (ref 78.0–100.0)
Platelets: 280 10*3/uL (ref 150.0–400.0)
RBC: 4.42 Mil/uL (ref 3.87–5.11)
RDW: 13.6 % (ref 11.5–15.5)
WBC: 7.3 10*3/uL (ref 4.0–10.5)

## 2015-10-20 LAB — LIPID PANEL
CHOL/HDL RATIO: 4
CHOLESTEROL: 250 mg/dL — AB (ref 0–200)
HDL: 58.3 mg/dL (ref >39.00)
NonHDL: 192.04
TRIGLYCERIDES: 253 mg/dL — AB (ref 0.0–149.0)
VLDL: 50.6 mg/dL — AB (ref 0.0–40.0)

## 2015-10-20 LAB — LDL CHOLESTEROL, DIRECT: Direct LDL: 140 mg/dL

## 2015-10-20 LAB — VITAMIN D 25 HYDROXY (VIT D DEFICIENCY, FRACTURES): VITD: 28.88 ng/mL — ABNORMAL LOW (ref 30.00–100.00)

## 2015-10-20 NOTE — Patient Instructions (Addendum)
  Ms. Greil , Thank you for taking time to come for your Medicare Wellness Visit. I appreciate your ongoing commitment to your health goals. Please review the following plan we discussed and let me know if I can assist you in the future.   These are the goals we discussed: 1. Labs before you leave 2. Please see an audiologist 3. Stop prilosec/omeprazole today. Start zantac 150mg  twice a day tomorrow morning before breakfast and then take before dinner. Do this for at least 1-2 weeks to see if symptoms controlled. If so- continue this therapy. Can use tums as needed as well 4. Probably 1000 IU of vitamin D   This is a list of the screening recommended for you and due dates:  Health Maintenance  Topic Date Due  .  Hepatitis C: One time screening is recommended by Center for Disease Control  (CDC) for  adults born from [birth year] through 1965.   02/23/2016*  . Flu Shot  11/21/2015  . Mammogram  01/15/2017  . Colon Cancer Screening  12/28/2017  . Tetanus Vaccine  04/22/2020  . DEXA scan (bone density measurement)  Completed  . Shingles Vaccine  Addressed  . Pneumonia vaccines  Completed  *Topic was postponed. The date shown is not the original due date.

## 2015-10-20 NOTE — Progress Notes (Signed)
Phone: [phone]  Subjective:  Patient presents today for their annual wellness visit.    Preventive Screening-Counseling & Management  Smoking Status: Never Smoker Second Hand Smoking status: No smokers in home  Risk Factors Regular exercise: working in yard, walking with 2-3x a week for 30-60 minutes.  Diet: works on fruits. Not best with veggies.   Wt Readings from Last 3 Encounters:  10/20/15 137 lb (62.143 kg)  01/30/15 142 lb (64.411 kg)  07/18/14 146 lb (66.225 kg)  Fall Risk: None  Fall Risk  10/20/2015 07/18/2014 11/16/2012  Falls in the past year? No No No    Cardiac risk factors:  advanced age (older than 28 for men, 46 for women)  Hyperlipidemia controlled HTN controlled No diabetes.    Depression Screen None. PHQ2 0  Depression screen [facility] 2/9 10/20/2015 07/18/2014 11/16/2012  Decreased Interest 0 0 0  Down, Depressed, Hopeless 0 0 0  PHQ - 2 Score 0 0 0    Activities of Daily Living Independent ADLs and IADLs   Hearing Difficulties: -patient endorses, gave handout on audiology groups in town  Cognitive Testing No reported trouble.   Normal 3 word recall  List the Names of Other Physician/Practitioners you currently use: -[facility] -Dentist - breast center for mammograms  Immunization History  Administered Date(s) Administered  . Influenza Split 03/12/2011  . Influenza,inj,Quad PF,36+ Mos 01/14/2014, 01/30/2015  . Pneumococcal Conjugate-13 07/18/2014  . Pneumococcal Polysaccharide-23 09/07/2010  Td outside facility 2012  Required Immunizations needed today : none  Screening tests- up to date. Mammogram yearly. Colonoscopy 2009  ROS- No pertinent positives discovered in course of AWV Ros pertinent- No chest pain or shortness of breath. No headache or blurry vision.   The following were reviewed and entered/updated in epic: Past Medical History  Diagnosis Date  . HYPERTENSION 10/31/2006  . GERD 10/31/2006  .  OSTEOPOROSIS 10/31/2006  . BREAST CANCER, HX OF 10/31/2006   Patient Active Problem List   Diagnosis Date Noted  . Diastolic dysfunction [id number]    Priority: Medium  . CKD (chronic kidney disease), stage III 01/14/2014    Priority: Medium  . Hyperlipidemia 11/10/2006    Priority: Medium  . Essential hypertension 10/31/2006    Priority: Medium  . BREAST CANCER, HX OF 10/31/2006    Priority: Medium  . Vitamin D deficiency 07/18/2014    Priority: Low  . Hemorrhoids 07/18/2014    Priority: Low  . LEFT BUNDLE BRANCH BLOCK 07/25/2009    Priority: Low  . GERD 10/31/2006    Priority: Low  . Osteopenia 10/31/2006    Priority: Low   Past Surgical History  Procedure Laterality Date  . Breast lumpectomy      followed by radiation and tamoxifen X 5 years    Family History  Problem Relation Age of Onset  . Heart disease Mother   . Stroke Mother     CVA mother age 34  . Breast cancer Mother   . Arthritis Other   . Cancer Other     breast < age 62  . Diabetes Other   . Hypertension Other   . Kidney disease Other   . Breast cancer Sister   . Breast cancer Maternal Grandmother   . Breast cancer Sister     x2    Medications- reviewed and updated Current Outpatient Prescriptions  Medication Sig Dispense Refill  . atorvastatin (LIPITOR) 20 MG tablet TAKE 1 TABLET ONCE A WEEK 90 tablet 1  . Calcium Carbonate-Vitamin  D (CALTRATE 600+D) 600-400 MG-UNIT per tablet Take 1 tablet by mouth daily.      . Cholecalciferol 50000 UNITS capsule Take 1 capsule (50,000 Units total) by mouth once a week. 12 capsule 0  . hydrochlorothiazide (HYDRODIURIL) 25 MG tablet TAKE 1 TABLET DAILY 90 tablet 2  . hydrocortisone (PROCTOSOL HC) 2.5 % rectal cream Place 1 application rectally 2 (two) times daily. 30 g 0  . lisinopril (PRINIVIL,ZESTRIL) 40 MG tablet TAKE 1 TABLET DAILY 90 tablet 2  . omeprazole (PRILOSEC) 20 MG capsule TAKE 1 CAPSULE DAILY 90 capsule 2   No current facility-administered  medications for this visit.    Allergies-reviewed and updated Allergies  Allergen Reactions  . Simvastatin     REACTION: nausea  . Shrimp [Shellfish Allergy] Rash    Social History   Social History  . Marital Status: Married    Spouse Name: N/A  . Number of Children: N/A  . Years of Education: N/A   Social History Main Topics  . Smoking status: Never Smoker   . Smokeless tobacco: Not on file  . Alcohol Use: Yes     Comment: very rare  . Drug Use: No  . Sexual Activity: Not on file   Other Topics Concern  . Not on file   Social History Narrative   Married 1969. 2 sons Nathaneil Canary in [place] in [place]. 5 grandkids, 1 greatgranddaughter, 1 great step grandson.       Retired from 56 years with [employer]      Hobbies: travel/sightseeing, time at [employer], [place] with friends. [employer] in [place]    Objective: BP 138/72 mmHg  Pulse 60  Temp(Src) 97.8 F (36.6 C) (Oral)  Ht 5' 2.5" (1.588 m)  Wt 137 lb (62.143 kg)  BMI 24.64 kg/m2  SpO2 98% Gen: NAD, resting comfortably HEENT: Mucous membranes are moist. Oropharynx normal Neck: no thyromegaly CV: RRR no murmurs rubs or gallops Lungs: CTAB no crackles, wheeze, rhonchi Abdomen: soft/nontender/nondistended/normal bowel sounds. No rebound or guarding.  Ext: no edema Skin: warm, dry Neuro: grossly normal, moves all extremities, PERRLA  Assessment/Plan:  AWV completed- discussed recommended screenings anddocumented any personalized health advice and referrals for preventive counseling. See AVS as well which was given to patient.   Status of chronic or acute concerns   Hyperlipidemia S No myalgias. on atorvastation 20mg  once a week, reasonable control with LDL <100 Lab Results  Component Value Date   CHOL 224* 02/25/2014   HDL 49.50 02/25/2014   LDLCALC 157 05/26/2007   LDLDIRECT 80.0 07/18/2014   TRIG 222.0* 02/25/2014   CHOLHDL 5 02/25/2014   A/P: update lipids today  Hypertension S:  controlled on hctz 25, lisinopril 25mg  BP Readings from Last 3 Encounters:  10/20/15 138/72  01/30/15 138/82  07/18/14 132/72  A/P:Continue current meds:  Doing well  CKD III S: GFR stable in 50s for most part- last visit was slightly better A/P: continue bp control and update bmet today  GERD S: reflux symptoms controlled on omeprazole 20mg -  A/P:we discussed potential trial of zantac  Vit D with labs. Thinks she takes 200 IU. Advised 1000 IU  Breast exam declined- mammogram only  1 year awv  Orders Placed This Encounter  Procedures  . CBC    [place]  . Comprehensive metabolic panel    [place]    Order Specific Question:  Has the patient fasted?    Answer:  No  . Lipid panel      Order Specific Question:  Has the patient fasted?    Answer:  No  . VITAMIN D 25 Hydroxy (Vit-D Deficiency, Fractures)    [place]  . POCT Urinalysis Dipstick (Automated)   Return precautions advised.  Garret Reddish, MD

## 2015-10-20 NOTE — Progress Notes (Signed)
Pre visit review using our clinic review tool, if applicable. No additional management support is needed unless otherwise documented below in the visit note. 

## 2015-12-08 ENCOUNTER — Other Ambulatory Visit: Payer: Self-pay | Admitting: Family Medicine

## 2015-12-08 MED ORDER — ATORVASTATIN CALCIUM 20 MG PO TABS: ORAL_TABLET | ORAL | 3 refills | Status: DC

## 2015-12-08 NOTE — Telephone Encounter (Signed)
New Rx sent in

## 2015-12-08 NOTE — Telephone Encounter (Signed)
°  Pt call to say that the last time she was here Dr Yong Channel increased the following med was increase to 2 times a week. She call today to ask for new RX that say 2 times a week    atorvastatin (LIPITOR) 20 MG tablet   Pharmacy ;[facility]

## 2015-12-12 ENCOUNTER — Encounter: Payer: Self-pay | Admitting: Family Medicine

## 2015-12-12 ENCOUNTER — Ambulatory Visit (INDEPENDENT_AMBULATORY_CARE_PROVIDER_SITE_OTHER): Payer: Medicare Other | Admitting: Family Medicine

## 2015-12-12 DIAGNOSIS — L237 Allergic contact dermatitis due to plants, except food: Secondary | ICD-10-CM

## 2015-12-12 DIAGNOSIS — K219 Gastro-esophageal reflux disease without esophagitis: Secondary | ICD-10-CM | POA: Diagnosis not present

## 2015-12-12 MED ORDER — METHYLPREDNISOLONE ACETATE 80 MG/ML IJ SUSP
80.0000 mg | Freq: Once | INTRAMUSCULAR | Status: AC
Start: 2015-12-12 — End: 2015-12-12
  Administered 2015-12-12: 80 mg via INTRAMUSCULAR

## 2015-12-12 NOTE — Progress Notes (Signed)
Subjective:  Michele Conley is a 70 y.o. year old very pleasant female patient who presents for/with See problem oriented charting ROS- No chest pain or shortness of breath. No lip or tongue swelling.see any ROS included in HPI as well.   Past Medical History-  Patient Active Problem List   Diagnosis Date Noted  . Diastolic dysfunction [id number]    Priority: Medium  . CKD (chronic kidney disease), stage III 01/14/2014    Priority: Medium  . Hyperlipidemia 11/10/2006    Priority: Medium  . Essential hypertension 10/31/2006    Priority: Medium  . BREAST CANCER, HX OF 10/31/2006    Priority: Medium  . Vitamin D deficiency 07/18/2014    Priority: Low  . Hemorrhoids 07/18/2014    Priority: Low  . LEFT BUNDLE BRANCH BLOCK 07/25/2009    Priority: Low  . GERD 10/31/2006    Priority: Low  . Osteopenia 10/31/2006    Priority: Low    Medications- reviewed and updated Current Outpatient Prescriptions  Medication Sig Dispense Refill  . atorvastatin (LIPITOR) 20 MG tablet Take 2 tablets by mouth once a week. 90 tablet 3  . Calcium Carbonate-Vitamin D (CALTRATE 600+D) 600-400 MG-UNIT per tablet Take 1 tablet by mouth daily.      . Cholecalciferol 50000 UNITS capsule Take 1 capsule (50,000 Units total) by mouth once a week. 12 capsule 0  . hydrochlorothiazide (HYDRODIURIL) 25 MG tablet TAKE 1 TABLET DAILY 90 tablet 2  . hydrocortisone (PROCTOSOL HC) 2.5 % rectal cream Place 1 application rectally 2 (two) times daily. 30 g 0  . lisinopril (PRINIVIL,ZESTRIL) 40 MG tablet TAKE 1 TABLET DAILY 90 tablet 2  . omeprazole (PRILOSEC) 20 MG capsule TAKE 1 CAPSULE DAILY 90 capsule 2   No current facility-administered medications for this visit.     Objective: BP 130/72 (BP Location: Left Arm, Patient Position: Sitting, Cuff Size: Normal)   Pulse 66   Temp 97.9 F (36.6 C) (Oral)   Wt 134 lb 9.6 oz (61.1 kg)   SpO2 98%   BMI 24.23 kg/m  Gen: NAD, resting comfortably CV: RRR no murmurs  rubs or gallops Lungs: CTAB no crackles, wheeze, rhonchi Abdomen: soft/nontender/nondistended/normal bowel sounds. No rebound or guarding.  Ext: no edema Skin: warm, dry Erythematous area throughout neck- several papules or vesicles noted. A few splotches on face similar- and slight area on inner bilateral wrists.  Neuro: grossly normal, moves all extremities  Assessment/Plan:  Rash S:patient working in son's yard on Sunday. Had everything covered but neck and face. Daughter was pulling some weeds and hit her neck- didn't think anything of it. That night and next day noted expanding red are aon neck and some onto face. Some vesicles. Very itchy. Trying to avoid scratching. Worsening through yesterday now stable. Tried some topical baking soda and vinegar only. No pain.  ROS-not ill appearing, no fever/chills. No new medications. Not immunocompromised. No mucus membrane involvement.  A/P: poison ivy dermatitis- treat with IM depo medrol. Has helped in past. On face so topicals not ideal. Does not want to do oral.     GERD S: trialed off omeprazole after awv- severe worsening of symptoms even on zantac- started back on omeprazole and complete resolution A/P: continue PPI   Meds ordered this encounter  Medications  . methylPREDNISolone acetate (DEPO-MEDROL) injection 80 mg    Return precautions advised.  Garret Reddish, MD

## 2015-12-12 NOTE — Progress Notes (Signed)
Pre visit review using our clinic review tool, if applicable. No additional management support is needed unless otherwise documented below in the visit note. 

## 2015-12-12 NOTE — Patient Instructions (Signed)
Depo medrol 80mg  please. This is a steroid (anti inflammatory) that should calm the rash down hopefully within 24-48 hours . See [place] back for new or worsening symptoms   Poison [facility] ivy is a inflammation of the skin (contact dermatitis) caused by touching the allergens on the leaves of the ivy plant following previous exposure to the plant. The rash usually appears 48 hours after exposure. The rash is usually bumps (papules) or blisters (vesicles) in a linear pattern. Depending on your own sensitivity, the rash may simply cause redness and itching, or it may also progress to blisters which may break open. These must be well cared for to prevent secondary bacterial (germ) infection, followed by scarring. Keep any open areas dry, clean, dressed, and covered with an antibacterial ointment if needed. The eyes may also get puffy. The puffiness is worst in the morning and gets better as the day progresses. This dermatitis usually heals without scarring, within 2 to 3 weeks without treatment. HOME CARE INSTRUCTIONS  Thoroughly wash with soap and water as soon as you have been exposed to poison ivy. You have about one half hour to remove the plant resin before it will cause the rash. This washing will destroy the oil or antigen on the skin that is causing, or will cause, the rash. Be sure to wash under your fingernails as any plant resin there will continue to spread the rash. Do not rub skin vigorously when washing affected area. Poison ivy cannot spread if no oil from the plant remains on your body. A rash that has progressed to weeping sores will not spread the rash unless you have not washed thoroughly. It is also important to wash any clothes you have been wearing as these may carry active allergens. The rash will return if you wear the unwashed clothing, even several days later. Avoidance of the plant in the future is the best measure. Poison ivy plant can be recognized by the number of leaves. Generally,  poison ivy has three leaves with flowering branches on a single stem. Diphenhydramine may be purchased over the counter and used as needed for itching. Do not drive with this medication if it makes you drowsy.Ask your caregiver about medication for children. SEEK MEDICAL CARE IF:  Open sores develop.  Redness spreads beyond area of rash.  You notice purulent (pus-like) discharge.  You have increased pain.  Other signs of infection develop (such as fever).   This information is not intended to replace advice given to you by your health care provider. Make sure you discuss any questions you have with your health care provider.   Document Released: 04/05/2000 Document Revised: 07/01/2011 Document Reviewed: 09/14/2014 Elsevier Interactive Patient Education [employer].

## 2015-12-12 NOTE — Assessment & Plan Note (Signed)
S: trialed off omeprazole after awv- severe worsening of symptoms even on zantac- started back on omeprazole and complete resolution A/P: continue PPI

## 2015-12-14 ENCOUNTER — Encounter: Payer: Self-pay | Admitting: Family Medicine

## 2015-12-15 MED ORDER — PREDNISONE 20 MG PO TABS: ORAL_TABLET | ORAL | 0 refills | Status: DC

## 2016-01-11 ENCOUNTER — Other Ambulatory Visit: Payer: Self-pay | Admitting: Family Medicine

## 2016-01-11 DIAGNOSIS — Z1231 Encounter for screening mammogram for malignant neoplasm of breast: Secondary | ICD-10-CM

## 2016-01-18 ENCOUNTER — Ambulatory Visit
Admission: RE | Admit: 2016-01-18 | Discharge: 2016-01-18 | Disposition: A | Discharge status: Home / Self Care | Payer: Medicare Other | Source: Ambulatory Visit | Attending: Family Medicine | Admitting: Family Medicine

## 2016-01-18 DIAGNOSIS — Z1231 Encounter for screening mammogram for malignant neoplasm of breast: Secondary | ICD-10-CM

## 2016-02-08 DIAGNOSIS — H25013 Cortical age-related cataract, bilateral: Secondary | ICD-10-CM | POA: Diagnosis not present

## 2016-06-07 ENCOUNTER — Other Ambulatory Visit: Payer: Self-pay | Admitting: Family Medicine

## 2016-10-21 ENCOUNTER — Encounter: Payer: Self-pay | Admitting: Family Medicine

## 2016-10-21 ENCOUNTER — Ambulatory Visit (INDEPENDENT_AMBULATORY_CARE_PROVIDER_SITE_OTHER): Payer: Medicare Other | Admitting: Family Medicine

## 2016-10-21 VITALS — BP 148/72 | HR 62 | Temp 98.1°F | Ht 63.0 in | Wt 143.2 lb

## 2016-10-21 DIAGNOSIS — Z0000 Encounter for general adult medical examination without abnormal findings: Secondary | ICD-10-CM | POA: Diagnosis not present

## 2016-10-21 DIAGNOSIS — M1A0720 Idiopathic chronic gout, left ankle and foot, without tophus (tophi): Secondary | ICD-10-CM | POA: Diagnosis not present

## 2016-10-21 DIAGNOSIS — M109 Gout, unspecified: Secondary | ICD-10-CM | POA: Insufficient documentation

## 2016-10-21 DIAGNOSIS — M8580 Other specified disorders of bone density and structure, unspecified site: Secondary | ICD-10-CM

## 2016-10-21 DIAGNOSIS — N183 Chronic kidney disease, stage 3 unspecified: Secondary | ICD-10-CM

## 2016-10-21 DIAGNOSIS — E785 Hyperlipidemia, unspecified: Secondary | ICD-10-CM | POA: Diagnosis not present

## 2016-10-21 DIAGNOSIS — I10 Essential (primary) hypertension: Secondary | ICD-10-CM | POA: Diagnosis not present

## 2016-10-21 DIAGNOSIS — K219 Gastro-esophageal reflux disease without esophagitis: Secondary | ICD-10-CM

## 2016-10-21 LAB — COMPREHENSIVE METABOLIC PANEL
ALBUMIN: 4.2 g/dL (ref 3.5–5.2)
ALT: 23 U/L (ref 0–35)
AST: 16 U/L (ref 0–37)
Alkaline Phosphatase: 118 U/L — ABNORMAL HIGH (ref 39–117)
BUN: 22 mg/dL (ref 6–23)
CALCIUM: 9.4 mg/dL (ref 8.4–10.5)
CHLORIDE: 108 meq/L (ref 96–112)
CO2: 23 meq/L (ref 19–32)
CREATININE: 0.98 mg/dL (ref 0.40–1.20)
GFR: 59.39 mL/min — ABNORMAL LOW (ref >60.00)
Glucose, Bld: 102 mg/dL — ABNORMAL HIGH (ref 70–99)
POTASSIUM: 4.7 meq/L (ref 3.5–5.1)
SODIUM: 141 meq/L (ref 135–145)
Total Bilirubin: 0.5 mg/dL (ref 0.2–1.2)
Total Protein: 6.9 g/dL (ref 6.0–8.3)

## 2016-10-21 LAB — URIC ACID: Uric Acid, Serum: 7.4 mg/dL — ABNORMAL HIGH (ref 2.4–7.0)

## 2016-10-21 LAB — LIPID PANEL
CHOL/HDL RATIO: 4
CHOLESTEROL: 252 mg/dL — AB (ref 0–200)
HDL: 56.9 mg/dL (ref >39.00)
NONHDL: 195.16
TRIGLYCERIDES: 259 mg/dL — AB (ref 0.0–149.0)
VLDL: 51.8 mg/dL — AB (ref 0.0–40.0)

## 2016-10-21 LAB — CBC
HEMATOCRIT: 38.8 % (ref 36.0–46.0)
Hemoglobin: 13 g/dL (ref 12.0–15.0)
MCHC: 33.5 g/dL (ref 30.0–36.0)
MCV: 89.2 fl (ref 78.0–100.0)
Platelets: 282 10*3/uL (ref 150.0–400.0)
RBC: 4.35 Mil/uL (ref 3.87–5.11)
RDW: 13.6 % (ref 11.5–15.5)
WBC: 8.2 10*3/uL (ref 4.0–10.5)

## 2016-10-21 LAB — LDL CHOLESTEROL, DIRECT: Direct LDL: 122 mg/dL

## 2016-10-21 MED ORDER — AMLODIPINE BESYLATE 5 MG PO TABS: 5.0000 mg | ORAL_TABLET | Freq: Every day | ORAL | 11 refills | Status: DC

## 2016-10-21 NOTE — Progress Notes (Signed)
Phone: [phone]  Subjective:  Patient presents today for their annual wellness visit.    Preventive Screening-Counseling & Management  Smoking Status: Never Smoker Second Hand Smoking status: No smokers in home  Risk Factors Regular exercise: last year was waling 2-3x a week for 30-60 minutes and was active working in yard- this year down to once a week for 30 mins (hot days bothering her) but still in yard up to two hours once a week. Had been walking with neighbor- encouraged restart Diet: last year had encouraged increased veggie intake. From last AWV weight is up 6 lbs. Up 9 lbs from august . We discussed trimming off perhaps 5 lbs Fall Risk: None  Fall Risk  10/21/2016 10/20/2015 07/18/2014 11/16/2012  Falls in the past year? No No No No    Cardiac risk factors:  advanced age (older than 81 for men, 2 for women)  Hyperlipidemia on statin- update lipids today No diabetes. - will screen with labs HTN controlled in past- see section below Family History: heart disease and CVA in mother   Depression Screen None. PHQ2 0  Depression screen [facility] 2/9 10/21/2016 10/20/2015 07/18/2014 11/16/2012  Decreased Interest 0 0 0 0  Down, Depressed, Hopeless 0 0 0 0  PHQ - 2 Score 0 0 0 0    Activities of Daily Living Independent ADLs and IADLs   Hearing Difficulties: -patient endorsed last year and given handout on audiology groups. She still has handout- has not gotten tested yet- encouraged thits  Cognitive Testing No reported trouble.   Normal 3 word recall  List the Names of Other Physician/Practitioners you currently use: -[facility] -Dentist q 6 months - breast center for mammograms. Also does self exams. Declines breast exam.   Immunization History  Administered Date(s) Administered  . Influenza Split 03/12/2011  . Influenza,inj,Quad PF,36+ Mos 01/14/2014, 01/30/2015  . Pneumococcal Conjugate-13 07/18/2014  . Pneumococcal Polysaccharide-23 09/07/2010    Required Immunizations needed today : offered shingrix - she will consider getting at her pharmacy  Screening tests- up to date Mammogram 01/18/16 and repeats yearly. Declines breast exam despite history ofbreast cancer 1996 Colonoscopy 12/29/2007 with 10 year repeat per abstraction. View of actual report shows 05/28/2005 and we discussed referral today - she opts for cologuard instead Aged out of pap smears  ROS- No pertinent positives discovered in course of AWV other than occasional hot flashes ROS- No chest pain or shortness of breath. No headache or blurry vision. no fevers, chills, fatigue/malaise, nausea/vomiting, or recent weight change. No blood in stool   The following were reviewed and entered/updated in epic: Past Medical History:  Diagnosis Date  . BREAST CANCER, HX OF 10/31/2006  . GERD 10/31/2006  . HYPERTENSION 10/31/2006  . OSTEOPOROSIS 10/31/2006   Patient Active Problem List   Diagnosis Date Noted  . Diastolic dysfunction 09/81/1914    Priority: Medium  . CKD (chronic kidney disease), stage III 01/14/2014    Priority: Medium  . Hyperlipidemia 11/10/2006    Priority: Medium  . Essential hypertension 10/31/2006    Priority: Medium  . BREAST CANCER, HX OF 10/31/2006    Priority: Medium  . Vitamin D deficiency 07/18/2014    Priority: Low  . Hemorrhoids 07/18/2014    Priority: Low  . LEFT BUNDLE BRANCH BLOCK 07/25/2009    Priority: Low  . GERD 10/31/2006    Priority: Low  . Osteopenia 10/31/2006    Priority: Low  . Gout 10/21/2016   Past Surgical History:  Procedure  Laterality Date  . BREAST LUMPECTOMY     followed by radiation and tamoxifen X 5 years    Family History  Problem Relation Age of Onset  . Heart disease Mother   . Stroke Mother        CVA mother age 38  . Breast cancer Mother   . Arthritis Other   . Cancer Other        breast < age 56  . Diabetes Other   . Hypertension Other   . Kidney disease Other   . Breast cancer Sister   .  Breast cancer Maternal Grandmother   . Breast cancer Sister        x2    Medications- reviewed and updated Current Outpatient Prescriptions  Medication Sig Dispense Refill  . atorvastatin (LIPITOR) 20 MG tablet Take 2 tablets by mouth once a week. 90 tablet 3  . Calcium Carbonate-Vitamin D (CALTRATE 600+D) 600-400 MG-UNIT per tablet Take 1 tablet by mouth daily.      . Cholecalciferol 50000 UNITS capsule Take 1 capsule (50,000 Units total) by mouth once a week. 12 capsule 0  . hydrochlorothiazide (HYDRODIURIL) 25 MG tablet TAKE 1 TABLET DAILY 90 tablet 2  . hydrocortisone (PROCTOSOL HC) 2.5 % rectal cream Place 1 application rectally 2 (two) times daily. 30 g 0  . lisinopril (PRINIVIL,ZESTRIL) 40 MG tablet TAKE 1 TABLET DAILY 90 tablet 2  . omeprazole (PRILOSEC) 20 MG capsule TAKE 1 CAPSULE DAILY 90 capsule 2   No current facility-administered medications for this visit.     Allergies-reviewed and updated Allergies  Allergen Reactions  . Simvastatin     REACTION: nausea  . Shrimp [Shellfish Allergy] Rash    Social History   Social History  . Marital status: Married    Spouse name: N/A  . Number of children: N/A  . Years of education: N/A   Social History Main Topics  . Smoking status: Never Smoker  . Smokeless tobacco: Never Used  . Alcohol use Yes     Comment: very rare  . Drug use: No  . Sexual activity: Not Asked   Other Topics Concern  . None   Social History Narrative   Married 1969. 2 sons Nathaneil Canary in [place] in [place]. 5 grandkids, 1 greatgranddaughter, 1 great step grandson.       Retired from 47 years with [employer]      Hobbies: travel/sightseeing, time at [employer], [place] with friends. [employer] in [place]    Objective: BP (!) 148/72   Pulse 62   Temp 98.1 F (36.7 C) (Oral)   Ht 5\' 3"  (1.6 m)   Wt 143 lb 3.2 oz (65 kg)   SpO2 99%   BMI 25.37 kg/m  Gen: NAD, resting comfortably HEENT: Mucous membranes are moist.  Oropharynx normal Neck: no thyromegaly CV: RRR no murmurs rubs or gallops Lungs: CTAB no crackles, wheeze, rhonchi Abdomen: soft/nontender/nondistended/normal bowel sounds. No rebound or guarding.  Ext: no edema Skin: warm, dry Some tenderness with palpation of left bunion Neuro: grossly normal, moves all extremities, PERRLA  Assessment/Plan:  AWV completed- discussed recommended screenings anddocumented any personalized health advice and referrals for preventive counseling. See AVS as well which was given to patient.   Status of chronic or acute concerns   Hyperlipidemia S: previously controlled on once a week atorvastatin 20mg  then last year was elevated and increased dose to twice a week. No myalgias. Some hot flashes since increase in meds Lab Results  Component Value Date   CHOL 250 (H) 10/20/2015   HDL 58.30 10/20/2015   LDLCALC 157 05/26/2007   LDLDIRECT 140.0 10/20/2015   TRIG 253.0 (H) 10/20/2015   CHOLHDL 4 10/20/2015   A/P: update lipids today  Essential hypertension S: controlled on hctz 25mg  and lisinopril 40mg  ASCVD 10 year risk calculation if age 91-79: already on statin- adjusting as able BP Readings from Last 3 Encounters:  10/21/16 (!) 148/72  12/12/15 130/72  10/20/15 138/72  A/P: We discussed blood pressure goal of <140/90 with CKD III. Stop hydrochlorothiazide. Start amlodipine 5mg . Follow up 5-6 weeks In regards to BP being up today- had not yet taken hctz yet so may be cause for elevation.    CKD (chronic kidney disease), stage III S: GFR stable in 50s for most part A/P: continue bp and lipid control- avoid nsaids. Check GFR today   GERD S: continues to be controlled on omeprazole 20mg .  A/P: considered zantac option but only wants to do 1 medication change at a time  Osteopenia S: osteopenia with with worst t score -1.4 A/P: discussed repeat bone density next year. Update bone density next year  5-6 weeks  Orders Placed This Encounter   Procedures  . CBC    [place]  . Comprehensive metabolic panel    [place]    Order Specific Question:   Has the patient fasted?    Answer:   No  . Lipid panel    [place]    Order Specific Question:   Has the patient fasted?    Answer:   No  . Uric Acid   Return precautions advised. Garret Reddish, MD

## 2016-10-21 NOTE — Patient Instructions (Addendum)
  Michele Conley , Thank you for taking time to come for your Medicare Wellness Visit. I appreciate your ongoing commitment to your health goals. Please review the following plan we discussed and let me know if I can assist you in the future.   These are the goals we discussed: 1. Restart walking 2 more times a week with your neighbor- try mornings or evenings when its cooler 2. We discussed trimming off perhaps 5 lbs 3. Please have your hearing tested 4. Send cologuard in 5. Blood pressure goal <140/90. Stop hydrochlorothiazide. Start amlodipine 5mg . Follow up 5-6 weeks 6. Ibuprofen or aleve products work well for gout- if you have a flare up get in to see [place]- with your kidney function I may want to consider an alternate to this called colchicine 7. Update bone density next year 8. Please stop by lab before you go  This is a list of the screening recommended for you and due dates:  Health Maintenance  Topic Date Due  .  Hepatitis C: One time screening is recommended by Center for Disease Control  (CDC) for  adults born from [birth year] through 1965.   11/20/2048*  . Flu Shot  11/20/2016  . Colon Cancer Screening  12/28/2017  . Mammogram  01/17/2018  . Tetanus Vaccine  04/22/2020  . DEXA scan (bone density measurement)  Completed  . Pneumonia vaccines  Completed  *Topic was postponed. The date shown is not the original due date.

## 2016-10-21 NOTE — Assessment & Plan Note (Signed)
S: GFR stable in 50s for most part A/P: continue bp and lipid control- avoid nsaids. Check GFR today

## 2016-10-21 NOTE — Assessment & Plan Note (Addendum)
S: controlled on hctz 25mg  and lisinopril 40mg  ASCVD 10 year risk calculation if age 71-79: already on statin- adjusting as able BP Readings from Last 3 Encounters:  10/21/16 (!) 148/72  12/12/15 130/72  10/20/15 138/72  A/P: We discussed blood pressure goal of <140/90 with CKD III. Stop hydrochlorothiazide. Start amlodipine 5mg . Follow up 5-6 weeks In regards to BP being up today- had not yet taken hctz yet so may be cause for elevation.

## 2016-10-21 NOTE — Assessment & Plan Note (Signed)
S: osteopenia with with worst t score -1.4 A/P: discussed repeat bone density next year. Update bone density next year

## 2016-10-21 NOTE — Assessment & Plan Note (Signed)
S: previously controlled on once a week atorvastatin 20mg  then last year was elevated and increased dose to twice a week. No myalgias. Some hot flashes since increase in meds Lab Results  Component Value Date   CHOL 250 (H) 10/20/2015   HDL 58.30 10/20/2015   LDLCALC 157 05/26/2007   LDLDIRECT 140.0 10/20/2015   TRIG 253.0 (H) 10/20/2015   CHOLHDL 4 10/20/2015   A/P: update lipids today

## 2016-10-21 NOTE — Addendum Note (Signed)
Addended by: Marin Olp on: 10/21/2016 08:57 AM   Modules accepted: Orders

## 2016-10-21 NOTE — Assessment & Plan Note (Signed)
S: continues to be controlled on omeprazole 20mg .  A/P: considered zantac option but only wants to do 1 medication change at a time

## 2016-10-29 DIAGNOSIS — Z1212 Encounter for screening for malignant neoplasm of rectum: Secondary | ICD-10-CM | POA: Diagnosis not present

## 2016-10-29 DIAGNOSIS — Z1211 Encounter for screening for malignant neoplasm of colon: Secondary | ICD-10-CM | POA: Diagnosis not present

## 2016-10-29 LAB — COLOGUARD: Cologuard: POSITIVE

## 2016-11-06 ENCOUNTER — Encounter: Payer: Self-pay | Admitting: Family Medicine

## 2016-11-06 ENCOUNTER — Telehealth: Payer: Self-pay | Admitting: Family Medicine

## 2016-11-06 DIAGNOSIS — R195 Other fecal abnormalities: Secondary | ICD-10-CM

## 2016-11-06 DIAGNOSIS — Z1211 Encounter for screening for malignant neoplasm of colon: Secondary | ICD-10-CM

## 2016-11-06 NOTE — Telephone Encounter (Signed)
Cologuard  was positive. We need to proceed with colonoscopy which I ordered. This does not mean there is colon cancer or precancer but we do need to investigate further  Please inform patient

## 2016-11-07 ENCOUNTER — Encounter: Payer: Self-pay | Admitting: Family Medicine

## 2016-11-07 ENCOUNTER — Encounter: Payer: Self-pay | Admitting: Gastroenterology

## 2016-11-07 NOTE — Telephone Encounter (Signed)
Called and spoke with patient. She is already scheduled for September 20 for her colonoscopy.

## 2016-12-02 ENCOUNTER — Encounter: Payer: Self-pay | Admitting: Family Medicine

## 2016-12-02 ENCOUNTER — Ambulatory Visit (INDEPENDENT_AMBULATORY_CARE_PROVIDER_SITE_OTHER): Payer: Medicare Other | Admitting: Family Medicine

## 2016-12-02 VITALS — BP 158/86 | HR 73 | Temp 98.1°F | Ht 63.0 in | Wt 144.6 lb

## 2016-12-02 DIAGNOSIS — I10 Essential (primary) hypertension: Secondary | ICD-10-CM

## 2016-12-02 DIAGNOSIS — M1A0720 Idiopathic chronic gout, left ankle and foot, without tophus (tophi): Secondary | ICD-10-CM | POA: Diagnosis not present

## 2016-12-02 DIAGNOSIS — E785 Hyperlipidemia, unspecified: Secondary | ICD-10-CM | POA: Diagnosis not present

## 2016-12-02 MED ORDER — METOPROLOL SUCCINATE ER 25 MG PO TB24: 25.0000 mg | ORAL_TABLET | Freq: Every day | ORAL | 3 refills | Status: DC

## 2016-12-02 NOTE — Assessment & Plan Note (Signed)
S: had been on atorvastatin 20mg  twice a week as of last visit (below lipids) with trig over 200 and LDL still 122. We increased to 3x a week. Mild myalgias.  Lab Results  Component Value Date   CHOL 252 (H) 10/21/2016   HDL 56.90 10/21/2016   LDLCALC 157 05/26/2007   LDLDIRECT 122.0 10/21/2016   TRIG 259.0 (H) 10/21/2016   CHOLHDL 4 10/21/2016  A/P: tolerating 3x a week though some mild myalgias- continue current rx

## 2016-12-02 NOTE — Assessment & Plan Note (Signed)
S: controlled poorly on  lisinopril 40mg  and amlodipine 5mg . We stopped HCTZ due to gout to see if we could reduce flare ups (no flare ups since.  BP Readings from Last 3 Encounters:  12/02/16 (!) 168/80   10/21/16 (!) 148/72. On lisinopril alone- had not taken hctz yet that day  12/12/15 130/72  10/20/15 138/72  A/P: From avs- "Continue lisinopril 40mg  and amlodipine 5mg . Start metoprolol 25 mg extended release (slows heart rate down some and can cause some fatigue but this is a low dose so hoping not). Follow up 4 weeks. Dash eating plan can also help"  Losartan is an option instead of lisinopril as may lower risk of tout (uricosuric effect)

## 2016-12-02 NOTE — Patient Instructions (Addendum)
Continue lisinopril 40mg  and amlodipine 5mg . Start metoprolol 25 mg extended release (slows heart rate down some and can cause some fatigue but this is a low dose so hoping not). Follow up 4 weeks. Dash eating plan can also help   DASH Eating Plan DASH stands for "Dietary Approaches to Stop Hypertension." The DASH eating plan is a healthy eating plan that has been shown to reduce high blood pressure (hypertension). It may also reduce your risk for type 2 diabetes, heart disease, and stroke. The DASH eating plan may also help with weight loss. What are tips for following this plan? General guidelines  Avoid eating more than 2,300 mg (milligrams) of salt (sodium) a day. If you have hypertension, you may need to reduce your sodium intake to 1,500 mg a day.  Limit alcohol intake to no more than 1 drink a day for nonpregnant women and 2 drinks a day for men. One drink equals 12 oz of beer, 5 oz of wine, or 1 oz of hard liquor.  Work with your health care provider to maintain a healthy body weight or to lose weight. Ask what an ideal weight is for you.  Get at least 30 minutes of exercise that causes your heart to beat faster (aerobic exercise) most days of the week. Activities may include walking, swimming, or biking.  Work with your health care provider or diet and nutrition specialist (dietitian) to adjust your eating plan to your individual calorie needs. Reading food labels  Check food labels for the amount of sodium per serving. Choose foods with less than 5 percent of the Daily Value of sodium. Generally, foods with less than 300 mg of sodium per serving fit into this eating plan.  To find whole grains, look for the word "whole" as the first word in the ingredient list. Shopping  Buy products labeled as "low-sodium" or "no salt added."  Buy fresh foods. Avoid canned foods and premade or frozen meals. Cooking  Avoid adding salt when cooking. Use salt-free seasonings or herbs instead of  table salt or sea salt. Check with your health care provider or pharmacist before using salt substitutes.  Do not fry foods. Cook foods using healthy methods such as baking, boiling, grilling, and broiling instead.  Cook with heart-healthy oils, such as olive, canola, soybean, or sunflower oil. Meal planning   Eat a balanced diet that includes: ? 5 or more servings of fruits and vegetables each day. At each meal, try to fill half of your plate with fruits and vegetables. ? Up to 6-8 servings of whole grains each day. ? Less than 6 oz of lean meat, poultry, or fish each day. A 3-oz serving of meat is about the same size as a deck of cards. One egg equals 1 oz. ? 2 servings of low-fat dairy each day. ? A serving of nuts, seeds, or beans 5 times each week. ? Heart-healthy fats. Healthy fats called Omega-3 fatty acids are found in foods such as flaxseeds and coldwater fish, like sardines, salmon, and mackerel.  Limit how much you eat of the following: ? Canned or prepackaged foods. ? Food that is high in trans fat, such as fried foods. ? Food that is high in saturated fat, such as fatty meat. ? Sweets, desserts, sugary drinks, and other foods with added sugar. ? Full-fat dairy products.  Do not salt foods before eating.  Try to eat at least 2 vegetarian meals each week.  Eat more home-cooked food and less restaurant,  buffet, and fast food.  When eating at a restaurant, ask that your food be prepared with less salt or no salt, if possible. What foods are recommended? The items listed may not be a complete list. Talk with your dietitian about what dietary choices are best for you. Grains Whole-grain or whole-wheat bread. Whole-grain or whole-wheat pasta. Brown rice. [place]. Bulgur. Whole-grain and low-sodium cereals. Pita bread. Low-fat, low-sodium crackers. Whole-wheat flour tortillas. Vegetables Fresh or frozen vegetables (raw, steamed, roasted, or grilled). Low-sodium or  reduced-sodium tomato and vegetable juice. Low-sodium or reduced-sodium tomato sauce and tomato paste. Low-sodium or reduced-sodium canned vegetables. Fruits All fresh, dried, or frozen fruit. Canned fruit in natural juice (without added sugar). Meat and other protein foods Skinless chicken or [place]. Ground chicken or [place]. Pork with fat trimmed off. Fish and seafood. Egg whites. Dried beans, peas, or lentils. Unsalted nuts, nut butters, and seeds. Unsalted canned beans. Lean cuts of beef with fat trimmed off. Low-sodium, lean deli meat. Dairy Low-fat (1%) or fat-free (skim) milk. Fat-free, low-fat, or reduced-fat cheeses. Nonfat, low-sodium ricotta or cottage cheese. Low-fat or nonfat yogurt. Low-fat, low-sodium cheese. Fats and oils Soft margarine without trans fats. Vegetable oil. Low-fat, reduced-fat, or light mayonnaise and salad dressings (reduced-sodium). Canola, safflower, olive, soybean, and sunflower oils. Avocado. Seasoning and other foods Herbs. Spices. Seasoning mixes without salt. Unsalted popcorn and pretzels. Fat-free sweets. What foods are not recommended? The items listed may not be a complete list. Talk with your dietitian about what dietary choices are best for you. Grains Baked goods made with fat, such as croissants, muffins, or some breads. Dry pasta or rice meal packs. Vegetables Creamed or fried vegetables. Vegetables in a cheese sauce. Regular canned vegetables (not low-sodium or reduced-sodium). Regular canned tomato sauce and paste (not low-sodium or reduced-sodium). Regular tomato and vegetable juice (not low-sodium or reduced-sodium). Angie Fava. Olives. Fruits Canned fruit in a light or heavy syrup. Fried fruit. Fruit in cream or butter sauce. Meat and other protein foods Fatty cuts of meat. Ribs. Fried meat. Berniece Salines. Sausage. Bologna and other processed lunch meats. Salami. Fatback. Hotdogs. Bratwurst. Salted nuts and seeds. Canned beans with added salt. Canned or  smoked fish. Whole eggs or egg yolks. Chicken or [place] with skin. Dairy Whole or 2% milk, cream, and half-and-half. Whole or full-fat cream cheese. Whole-fat or sweetened yogurt. Full-fat cheese. Nondairy creamers. Whipped toppings. Processed cheese and cheese spreads. Fats and oils Butter. Stick margarine. Lard. Shortening. Ghee. Bacon fat. Tropical oils, such as coconut, palm kernel, or palm oil. Seasoning and other foods Salted popcorn and pretzels. Onion salt, garlic salt, seasoned salt, table salt, and sea salt. Worcestershire sauce. Tartar sauce. Barbecue sauce. Teriyaki sauce. Soy sauce, including reduced-sodium. Steak sauce. Canned and packaged gravies. Fish sauce. Oyster sauce. Cocktail sauce. Horseradish that you find on the shelf. Ketchup. Mustard. Meat flavorings and tenderizers. Bouillon cubes. Hot sauce and Tabasco sauce. Premade or packaged marinades. Premade or packaged taco seasonings. Relishes. Regular salad dressings. Where to find more information:  National Heart, Lung, and [place]: https://wilson-eaton.com/  American Heart Association: www.heart.org Summary  The DASH eating plan is a healthy eating plan that has been shown to reduce high blood pressure (hypertension). It may also reduce your risk for type 2 diabetes, heart disease, and stroke.  With the DASH eating plan, you should limit salt (sodium) intake to 2,300 mg a day. If you have hypertension, you may need to reduce your sodium intake to 1,500 mg a day.  When  on the DASH eating plan, aim to eat more fresh fruits and vegetables, whole grains, lean proteins, low-fat dairy, and heart-healthy fats.  Work with your health care provider or diet and nutrition specialist (dietitian) to adjust your eating plan to your individual calorie needs. This information is not intended to replace advice given to you by your health care provider. Make sure you discuss any questions you have with your health care provider. Document  Released: 03/28/2011 Document Revised: 04/01/2016 Document Reviewed: 04/01/2016 Elsevier Interactive Patient Education  2017 [employer].

## 2016-12-02 NOTE — Assessment & Plan Note (Signed)
Last visit unintentionally placed in overview  "S: reports 4 attacks of gout in the last year. At least she believes- severe enough to not bend toe. Gets really red and swollen A/P: we will stop hctz. Get uric acid- if still having episodes may need allopurinol. Also has bunion there so possible pain could be from bunion" Lab Results  Component Value Date   LABURIC 7.4 (H) 10/21/2016  Today patient reports no flares since being off HCTZ though only short period. Uric acid was 7.4. If she has flare ups off hctz perhaps more than 1 a year would need allopurinol- if on allopurinol may could reuse hctz

## 2016-12-02 NOTE — Progress Notes (Signed)
Subjective:  Michele Conley is a 71 y.o. year old very pleasant female patient who presents for/with See problem oriented charting ROS- admits to some edema, no gout flares, no chest pain or shortness of breath   Past Medical History-  Patient Active Problem List   Diagnosis Date Noted  . Diastolic dysfunction 32/44/0102    Priority: Medium  . CKD (chronic kidney disease), stage III 01/14/2014    Priority: Medium  . Hyperlipidemia 11/10/2006    Priority: Medium  . Essential hypertension 10/31/2006    Priority: Medium  . BREAST CANCER, HX OF 10/31/2006    Priority: Medium  . Vitamin D deficiency 07/18/2014    Priority: Low  . Hemorrhoids 07/18/2014    Priority: Low  . LEFT BUNDLE BRANCH BLOCK 07/25/2009    Priority: Low  . GERD 10/31/2006    Priority: Low  . Osteopenia 10/31/2006    Priority: Low  . Positive colorectal cancer screening using Cologuard test 11/06/2016  . Gout 10/21/2016    Medications- reviewed and updated Current Outpatient Prescriptions  Medication Sig Dispense Refill  . amLODipine (NORVASC) 5 MG tablet Take 1 tablet (5 mg total) by mouth daily. 30 tablet 11  . atorvastatin (LIPITOR) 20 MG tablet Take 2 tablets by mouth once a week. (Patient taking differently: Take 20 mg by mouth. Take 3 tablets by mouth once a week.) 90 tablet 3  . Calcium Carbonate-Vitamin D (CALTRATE 600+D) 600-400 MG-UNIT per tablet Take 1 tablet by mouth daily.      . Cholecalciferol 50000 UNITS capsule Take 1 capsule (50,000 Units total) by mouth once a week. 12 capsule 0  . hydrochlorothiazide (HYDRODIURIL) 25 MG tablet TAKE 1 TABLET DAILY 90 tablet 2  . hydrocortisone (PROCTOSOL HC) 2.5 % rectal cream Place 1 application rectally 2 (two) times daily. 30 g 0  . lisinopril (PRINIVIL,ZESTRIL) 40 MG tablet TAKE 1 TABLET DAILY 90 tablet 2  . omeprazole (PRILOSEC) 20 MG capsule TAKE 1 CAPSULE DAILY 90 capsule 2   No current facility-administered medications for this visit.      Objective: BP (!) 158/86   Pulse 73   Temp 98.1 F (36.7 C) (Oral)   Ht 5\' 3"  (1.6 m)   Wt 144 lb 9.6 oz (65.6 kg)   SpO2 92%   BMI 25.61 kg/m  Gen: NAD, resting comfortably CV: RRR no murmurs rubs or gallops Lungs: CTAB no crackles, wheeze, rhonchi Abdomen: soft/nontender/nondistended/normal bowel sounds. No rebound or guarding.  Ext: 1+ edema Skin: warm, dry Neuro: grossly normal, moves all extremities  Assessment/Plan:  See gerd section from last note  Hyperlipidemia S: had been on atorvastatin 20mg  twice a week as of last visit (below lipids) with trig over 200 and LDL still 122. We increased to 3x a week. Mild myalgias.  Lab Results  Component Value Date   CHOL 252 (H) 10/21/2016   HDL 56.90 10/21/2016   LDLCALC 157 05/26/2007   LDLDIRECT 122.0 10/21/2016   TRIG 259.0 (H) 10/21/2016   CHOLHDL 4 10/21/2016  A/P: tolerating 3x a week though some mild myalgias- continue current rx  Essential hypertension S: controlled poorly on  lisinopril 40mg  and amlodipine 5mg . We stopped HCTZ due to gout to see if we could reduce flare ups (no flare ups since.  BP Readings from Last 3 Encounters:  12/02/16 (!) 168/80   10/21/16 (!) 148/72. On lisinopril alone- had not taken hctz yet that day  12/12/15 130/72  10/20/15 138/72  A/P: From avs- "Continue lisinopril 40mg   and amlodipine 5mg . Start metoprolol 25 mg extended release (slows heart rate down some and can cause some fatigue but this is a low dose so hoping not). Follow up 4 weeks. Dash eating plan can also help"  Losartan is an option instead of lisinopril as may lower risk of tout (uricosuric effect)  Gout Last visit unintentionally placed in overview  "S: reports 4 attacks of gout in the last year. At least she believes- severe enough to not bend toe. Gets really red and swollen A/P: we will stop hctz. Get uric acid- if still having episodes may need allopurinol. Also has bunion there so possible pain could be from  bunion" Lab Results  Component Value Date   LABURIC 7.4 (H) 10/21/2016  Today patient reports no flares since being off HCTZ though only short period. Uric acid was 7.4. If she has flare ups off hctz perhaps more than 1 a year would need allopurinol- if on allopurinol may could reuse hctz    Return in about 4 weeks (around 12/30/2016).  Meds ordered this encounter  Medications  . metoprolol succinate (TOPROL-XL) 25 MG 24 hr tablet    Sig: Take 1 tablet (25 mg total) by mouth daily.    Dispense:  90 tablet    Refill:  3   Return precautions advised.  Garret Reddish, MD

## 2016-12-07 ENCOUNTER — Other Ambulatory Visit: Payer: Self-pay | Admitting: Family Medicine

## 2016-12-26 ENCOUNTER — Ambulatory Visit (AMBULATORY_SURGERY_CENTER): Payer: Self-pay | Admitting: *Deleted

## 2016-12-26 VITALS — Ht 62.0 in | Wt 142.6 lb

## 2016-12-26 DIAGNOSIS — R195 Other fecal abnormalities: Secondary | ICD-10-CM

## 2016-12-26 MED ORDER — NA SULFATE-K SULFATE-MG SULF 17.5-3.13-1.6 GM/177ML PO SOLN: 1.0000 | Freq: Once | ORAL | 0 refills | Status: AC

## 2016-12-26 NOTE — Progress Notes (Signed)
No egg or soy allergy known to patient  No issues with past sedation with any surgeries  or procedures, no intubation problems  No diet pills per patient No home 02 use per patient  No blood thinners per patient  Pt denies issues with constipation - occ has an issue but does not use medications for this  No A fib or A flutter  EMMI video sent to pt's e mail  Husband in [place] with pt today

## 2017-01-02 ENCOUNTER — Ambulatory Visit: Payer: Medicare Other | Admitting: Family Medicine

## 2017-01-09 ENCOUNTER — Encounter: Payer: Self-pay | Admitting: Gastroenterology

## 2017-01-09 ENCOUNTER — Ambulatory Visit (AMBULATORY_SURGERY_CENTER): Payer: Medicare Other | Admitting: Gastroenterology

## 2017-01-09 VITALS — BP 159/65 | HR 64 | Temp 98.7°F | Resp 12 | Ht 62.0 in | Wt 142.0 lb

## 2017-01-09 DIAGNOSIS — Z1211 Encounter for screening for malignant neoplasm of colon: Secondary | ICD-10-CM | POA: Diagnosis not present

## 2017-01-09 DIAGNOSIS — R195 Other fecal abnormalities: Secondary | ICD-10-CM

## 2017-01-09 DIAGNOSIS — D122 Benign neoplasm of ascending colon: Secondary | ICD-10-CM | POA: Diagnosis not present

## 2017-01-09 MED ORDER — SODIUM CHLORIDE 0.9 % IV SOLN: 500.0000 mL | INTRAVENOUS | Status: DC

## 2017-01-09 NOTE — Op Note (Signed)
[facility] Patient Name: Michele Conley Procedure Date: 01/09/2017 9:55 AM MRN: [id number] Endoscopist: Mauri Pole , MD Age: 71 Referring MD:  Date of Birth: 1946-01-15 Gender: Female Account #: [id number] Procedure:                Colonoscopy Indications:              Positive Cologuard test Medicines:                Monitored Anesthesia Care Procedure:                Pre-Anesthesia Assessment:                           - Prior to the procedure, a History and Physical                            was performed, and patient medications and                            allergies were reviewed. The patient's tolerance of                            previous anesthesia was also reviewed. The risks                            and benefits of the procedure and the sedation                            options and risks were discussed with the patient.                            All questions were answered, and informed consent                            was obtained. Prior Anticoagulants: The patient                            last took aspirin 1 day prior to the procedure. ASA                            Grade Assessment: II - A patient with mild systemic                            disease. After reviewing the risks and benefits,                            the patient was deemed in satisfactory condition to                            undergo the procedure.                           After obtaining informed consent, the colonoscope  was passed under direct vision. Throughout the                            procedure, the patient's blood pressure, pulse, and                            oxygen saturations were monitored continuously. The                            Colonoscope was introduced through the anus and                            advanced to the the terminal ileum, with                            identification of the appendiceal orifice and IC                         valve. The quality of the bowel preparation was                            excellent. The terminal ileum, ileocecal valve,                            appendiceal orifice, and rectum were photographed. Scope In: 9:59:15 AM Scope Out: 10:11:27 AM Scope Withdrawal Time: 0 hours 8 minutes 32 seconds  Total Procedure Duration: 0 hours 12 minutes 12 seconds  Findings:                 The perianal and digital rectal examinations were                            normal.                           A 7 mm polyp was found in the ascending colon. The                            polyp was sessile. The polyp was removed with a                            cold snare. Resection and retrieval were complete.                           Non-bleeding internal hemorrhoids were found during                            retroflexion. The hemorrhoids were small.                           The exam was otherwise without abnormality. Complications:            No immediate complications. Estimated Blood Loss:     Estimated blood loss was minimal. Impression:               -  One 7 mm polyp in the ascending colon, removed                            with a cold snare. Resected and retrieved.                           - Non-bleeding internal hemorrhoids.                           - The examination was otherwise normal. Recommendation:           - Patient has a contact number available for                            emergencies. The signs and symptoms of potential                            delayed complications were discussed with the                            patient. Return to normal activities tomorrow.                            Written discharge instructions were provided to the                            patient.                           - Resume previous diet.                           - Continue present medications.                           - Await pathology results.                            - Repeat colonoscopy in 5 years for surveillance                            based on pathology results. Mauri Pole, MD 01/09/2017 10:18:25 AM This report has been signed electronically.

## 2017-01-09 NOTE — Progress Notes (Signed)
Pt's states no medical or surgical changes since previsit or office visit. 

## 2017-01-09 NOTE — Progress Notes (Signed)
Called to room to assist during endoscopic procedure.  Patient ID and intended procedure confirmed with present staff. Received instructions for my participation in the procedure from the performing physician.  

## 2017-01-09 NOTE — Patient Instructions (Signed)
**  Handouts given on polyps and hemorrhoids**   YOU HAD AN ENDOSCOPIC PROCEDURE TODAY: Refer to the procedure report and other information in the discharge instructions given to you for any specific questions about what was found during the examination. If this information does not answer your questions, please call [place] office at [phone] to clarify.   YOU SHOULD EXPECT: Some feelings of bloating in the abdomen. Passage of more gas than usual. Walking can help get rid of the air that was put into your GI tract during the procedure and reduce the bloating. If you had a lower endoscopy (such as a colonoscopy or flexible sigmoidoscopy) you may notice spotting of blood in your stool or on the toilet paper. Some abdominal soreness may be present for a day or two, also.  DIET: Your first meal following the procedure should be a light meal and then it is ok to progress to your normal diet. A half-sandwich or bowl of soup is an example of a good first meal. Heavy or fried foods are harder to digest and may make you feel nauseous or bloated. Drink plenty of fluids but you should avoid alcoholic beverages for 24 hours. If you had a esophageal dilation, please see attached instructions for diet.    ACTIVITY: Your care partner should take you home directly after the procedure. You should plan to take it easy, moving slowly for the rest of the day. You can resume normal activity the day after the procedure however YOU SHOULD NOT DRIVE, use power tools, machinery or perform tasks that involve climbing or major physical exertion for 24 hours (because of the sedation medicines used during the test).   SYMPTOMS TO REPORT IMMEDIATELY: A gastroenterologist can be reached at any hour. Please call [phone]  for any of the following symptoms:  Following lower endoscopy (colonoscopy, flexible sigmoidoscopy) Excessive amounts of blood in the stool  Significant tenderness, worsening of abdominal pains  Swelling of  the abdomen that is new, acute  Fever of 100 or higher    FOLLOW UP:  If any biopsies were taken you will be contacted by phone or by letter within the next 1-3 weeks. Call [phone]  if you have not heard about the biopsies in 3 weeks.  Please also call with any specific questions about appointments or follow up tests.  

## 2017-01-10 ENCOUNTER — Telehealth: Payer: Self-pay | Admitting: *Deleted

## 2017-01-10 NOTE — Telephone Encounter (Signed)
  Follow up Call-  Call back number 01/09/2017  Post procedure Call Back phone  # [phone]  Permission to leave phone message Yes  Some recent data might be hidden     Patient questions:  Do you have a fever, pain , or abdominal swelling? No. Pain Score  0 *  Have you tolerated food without any problems? Yes.    Have you been able to return to your normal activities? Yes.    Do you have any questions about your discharge instructions: Diet   No. Medications  No. Follow up visit  No.  Do you have questions or concerns about your Care? No.  Actions: * If pain score is 4 or above: No action needed, pain <4.

## 2017-01-17 ENCOUNTER — Ambulatory Visit (INDEPENDENT_AMBULATORY_CARE_PROVIDER_SITE_OTHER): Payer: Medicare Other | Admitting: Family Medicine

## 2017-01-17 ENCOUNTER — Encounter: Payer: Self-pay | Admitting: Family Medicine

## 2017-01-17 VITALS — BP 130/74 | HR 58 | Temp 97.8°F | Ht 62.0 in | Wt 138.8 lb

## 2017-01-17 DIAGNOSIS — Z23 Encounter for immunization: Secondary | ICD-10-CM

## 2017-01-17 DIAGNOSIS — M1A0720 Idiopathic chronic gout, left ankle and foot, without tophus (tophi): Secondary | ICD-10-CM

## 2017-01-17 DIAGNOSIS — I10 Essential (primary) hypertension: Secondary | ICD-10-CM

## 2017-01-17 NOTE — Progress Notes (Signed)
Subjective:  Michele Conley is a 71 y.o. year old very pleasant female patient who presents for/with See problem oriented charting ROS- some pain in great toe. No chest pain or shortness of breath. No headache or blurry vision.    Past Medical History-  Patient Active Problem List   Diagnosis Date Noted  . Diastolic dysfunction 91/47/8295    Priority: Medium  . CKD (chronic kidney disease), stage III 01/14/2014    Priority: Medium  . Hyperlipidemia 11/10/2006    Priority: Medium  . Essential hypertension 10/31/2006    Priority: Medium  . BREAST CANCER, HX OF 10/31/2006    Priority: Medium  . Vitamin D deficiency 07/18/2014    Priority: Low  . Hemorrhoids 07/18/2014    Priority: Low  . LEFT BUNDLE BRANCH BLOCK 07/25/2009    Priority: Low  . GERD 10/31/2006    Priority: Low  . Osteopenia 10/31/2006    Priority: Low  . Positive colorectal cancer screening using Cologuard test 11/06/2016  . Gout 10/21/2016    Medications- reviewed and updated Current Outpatient Prescriptions  Medication Sig Dispense Refill  . amLODipine (NORVASC) 5 MG tablet Take 1 tablet (5 mg total) by mouth daily. 30 tablet 11  . aspirin 81 MG chewable tablet Chew 81 mg by mouth daily. EVERY OTHER DAY    . atorvastatin (LIPITOR) 20 MG tablet TAKE 2 TABLETS ONCE A WEEK 90 tablet 3  . Calcium Carbonate-Vitamin D (CALTRATE 600+D) 600-400 MG-UNIT per tablet Take 1 tablet by mouth daily.      . Coenzyme Q10 (CO Q 10 PO) Take 50 mg by mouth daily.    . Cyanocobalamin (VITAMIN B 12 PO) Take 500 mcg by mouth daily.    . hydrocortisone (PROCTOSOL HC) 2.5 % rectal cream Place 1 application rectally 2 (two) times daily. 30 g 0  . KRILL OIL OMEGA-3 PO Take 1 capsule by mouth daily.    Marland Kitchen lisinopril (PRINIVIL,ZESTRIL) 40 MG tablet TAKE 1 TABLET DAILY 90 tablet 2  . metoprolol succinate (TOPROL-XL) 25 MG 24 hr tablet Take 1 tablet (25 mg total) by mouth daily. 90 tablet 3  . omeprazole (PRILOSEC) 20 MG capsule TAKE 1  CAPSULE DAILY 90 capsule 2   No current facility-administered medications for this visit.     Objective: BP 130/74 (BP Location: Left Arm, Patient Position: Sitting, Cuff Size: Large)   Pulse (!) 58   Temp 97.8 F (36.6 C) (Oral)   Ht 5\' 2"  (1.575 m)   Wt 138 lb 12.8 oz (63 kg)   SpO2 95%   BMI 25.39 kg/m  Gen: NAD, resting comfortably CV: RRR no murmurs rubs or gallops Lungs: CTAB no crackles, wheeze, rhonchi Ext: trace edema Skin: warm, dry, no rash  Assessment/Plan:  Gout S:  twinges of pain in left great toe with prior gout attacks.  A/P: prior uric acid over 7. Discussed allopurinol option. Discussed losartan instead of lisinopril. Wants to make one change at a time so will see how blood pressure does over next month first then we will decide on next steps. I would lean toward allopurinol to lower uric acid and reduce crystal deposition.   Essential hypertension S: controlled on lisinopril 40mg , amlodipine 5mg . Last visit started metoprolol 25mg  ER. Slight increase in fatigue. Last night states HR was 47. BP Readings from Last 3 Encounters:  01/17/17 130/74  01/09/17 (!) 159/65  12/02/16 (!) 158/86  A/P: We discussed blood pressure goal of <140/90. Continue current meds:  Very mild  bradycardia but asymptomatic - will drop to 12.5mg  of metoprolol and see if symptoms remain controlled. Once again Losartan option instead of lisinopril - due to gout history (uricosuric effect)  Orders Placed This Encounter  Procedures  . Flu vaccine HIGH DOSE PF   Return precautions advised.  Garret Reddish, MD

## 2017-01-17 NOTE — Patient Instructions (Signed)
Cut the metoprolol in half. Hoping home blood pressure remain<140/90 and HR remains between 50-100.   Monitor home blood pressure- let me know in about a month how it is doing. If doing well but continue to have left great toe pain- perhaps start allopurinol 100mg  to try to reduce risk of gout attacks in future

## 2017-01-18 DIAGNOSIS — Z860101 Personal history of adenomatous and serrated colon polyps: Secondary | ICD-10-CM | POA: Insufficient documentation

## 2017-01-18 DIAGNOSIS — Z86010 Personal history of colonic polyps: Secondary | ICD-10-CM | POA: Insufficient documentation

## 2017-01-18 NOTE — Assessment & Plan Note (Signed)
S:  twinges of pain in left great toe with prior gout attacks.  A/P: prior uric acid over 7. Discussed allopurinol option. Discussed losartan instead of lisinopril. Wants to make one change at a time so will see how blood pressure does over next month first then we will decide on next steps. I would lean toward allopurinol to lower uric acid and reduce crystal deposition.

## 2017-01-18 NOTE — Assessment & Plan Note (Signed)
S: controlled on lisinopril 40mg , amlodipine 5mg . Last visit started metoprolol 25mg  ER. Slight increase in fatigue. Last night states HR was 47. BP Readings from Last 3 Encounters:  01/17/17 130/74  01/09/17 (!) 159/65  12/02/16 (!) 158/86  A/P: We discussed blood pressure goal of <140/90. Continue current meds:  Very mild bradycardia but asymptomatic - will drop to 12.5mg  of metoprolol and see if symptoms remain controlled. Once again Losartan option instead of lisinopril - due to gout history (uricosuric effect)

## 2017-01-20 ENCOUNTER — Encounter: Payer: Self-pay | Admitting: Gastroenterology

## 2017-01-21 ENCOUNTER — Other Ambulatory Visit: Payer: Self-pay | Admitting: Family Medicine

## 2017-01-21 DIAGNOSIS — Z1231 Encounter for screening mammogram for malignant neoplasm of breast: Secondary | ICD-10-CM

## 2017-02-07 ENCOUNTER — Ambulatory Visit
Admission: RE | Admit: 2017-02-07 | Discharge: 2017-02-07 | Disposition: A | Discharge status: Home / Self Care | Payer: Medicare Other | Source: Ambulatory Visit | Attending: Family Medicine | Admitting: Family Medicine

## 2017-02-07 DIAGNOSIS — Z1231 Encounter for screening mammogram for malignant neoplasm of breast: Secondary | ICD-10-CM

## 2017-02-10 ENCOUNTER — Other Ambulatory Visit: Payer: Self-pay | Admitting: Family Medicine

## 2017-02-10 DIAGNOSIS — R928 Other abnormal and inconclusive findings on diagnostic imaging of breast: Secondary | ICD-10-CM

## 2017-02-13 ENCOUNTER — Ambulatory Visit: Admission: RE | Admit: 2017-02-13 | Payer: Medicare Other | Source: Ambulatory Visit

## 2017-02-13 ENCOUNTER — Ambulatory Visit
Admission: RE | Admit: 2017-02-13 | Discharge: 2017-02-13 | Disposition: A | Discharge status: Home / Self Care | Payer: Medicare Other | Source: Ambulatory Visit | Attending: Family Medicine | Admitting: Family Medicine

## 2017-02-13 DIAGNOSIS — R928 Other abnormal and inconclusive findings on diagnostic imaging of breast: Secondary | ICD-10-CM | POA: Diagnosis not present

## 2017-02-13 HISTORY — DX: Personal history of irradiation: Z92.3

## 2017-03-04 ENCOUNTER — Other Ambulatory Visit: Payer: Self-pay | Admitting: Family Medicine

## 2017-05-30 ENCOUNTER — Ambulatory Visit (INDEPENDENT_AMBULATORY_CARE_PROVIDER_SITE_OTHER): Payer: Medicare Other | Admitting: Family Medicine

## 2017-05-30 ENCOUNTER — Encounter: Payer: Self-pay | Admitting: Family Medicine

## 2017-05-30 VITALS — BP 126/70 | HR 74 | Temp 97.7°F | Resp 12 | Ht 62.0 in | Wt 139.2 lb

## 2017-05-30 DIAGNOSIS — K219 Gastro-esophageal reflux disease without esophagitis: Secondary | ICD-10-CM

## 2017-05-30 DIAGNOSIS — I10 Essential (primary) hypertension: Secondary | ICD-10-CM

## 2017-05-30 DIAGNOSIS — N183 Chronic kidney disease, stage 3 unspecified: Secondary | ICD-10-CM

## 2017-05-30 DIAGNOSIS — M159 Polyosteoarthritis, unspecified: Secondary | ICD-10-CM | POA: Diagnosis not present

## 2017-05-30 DIAGNOSIS — E559 Vitamin D deficiency, unspecified: Secondary | ICD-10-CM | POA: Diagnosis not present

## 2017-05-30 DIAGNOSIS — M1A0720 Idiopathic chronic gout, left ankle and foot, without tophus (tophi): Secondary | ICD-10-CM

## 2017-05-30 LAB — MICROALBUMIN / CREATININE URINE RATIO
Creatinine,U: 133.1 mg/dL
MICROALB/CREAT RATIO: 1.2 mg/g (ref 0.0–30.0)
Microalb, Ur: 1.5 mg/dL (ref 0.0–1.9)

## 2017-05-30 LAB — BASIC METABOLIC PANEL
BUN: 21 mg/dL (ref 6–23)
CHLORIDE: 108 meq/L (ref 96–112)
CO2: 27 mEq/L (ref 19–32)
Calcium: 9 mg/dL (ref 8.4–10.5)
Creatinine, Ser: 0.91 mg/dL (ref 0.40–1.20)
GFR: 64.58 mL/min (ref >60.00)
Glucose, Bld: 86 mg/dL (ref 70–99)
POTASSIUM: 4.2 meq/L (ref 3.5–5.1)
Sodium: 143 mEq/L (ref 135–145)

## 2017-05-30 LAB — VITAMIN D 25 HYDROXY (VIT D DEFICIENCY, FRACTURES): VITD: 24.13 ng/mL — AB (ref 30.00–100.00)

## 2017-05-30 MED ORDER — MELOXICAM 7.5 MG PO TABS: 7.5000 mg | ORAL_TABLET | Freq: Every day | ORAL | 0 refills | Status: DC

## 2017-05-30 NOTE — Assessment & Plan Note (Signed)
Adequately controlled. No changes in current management. DASH diet recommended. Eye exam recommended annually. F/U in 5 months, before if needed.

## 2017-05-30 NOTE — Assessment & Plan Note (Signed)
OTC Vit D3 1000 U daily. Further recommendations will be given according to lab results.

## 2017-05-30 NOTE — Progress Notes (Signed)
HPI:   Ms.Arnie J Dumais is a 72 y.o. female, who is here today to establish care with me.  Former PCP: Dr Yong Channel Last preventive routine visit: 05/2012  Chronic medical problems: Hyperlipidemia, hypertension, gout, vitamin D deficiency, and OA.  Hypertension:    Currently on Lisinopril 40 mg,Amlodipine 5 mg,and Metoprolol Succinate 25 mg daily.   She is taking medications as instructed, no side effects reported.  She has not noted unusual headache, visual changes, exertional chest pain, dyspnea,  focal weakness, or edema.   Lab Results  Component Value Date   CREATININE 0.98 10/21/2016   BUN 22 10/21/2016   NA 141 10/21/2016   K 4.7 10/21/2016   CL 108 10/21/2016   CO2 23 10/21/2016    CKD III, she has not noted gross hematuria or foam in urine.  Vit D deficiency,she is not on vit D supplementation.  Concerns today: Joint pain.  IP joint pain for years,getting worse after Dx with gout. She takes Tylenol OTC, which helps little.  "Steam burn" like pain in joints,stiffness. Mainly right  3rd finger DIP, with mild constant pain. Pain is aggravated by fine motor activities. No limitation in daily activities.  Also pain in shoulders and hips.  Gout attacks about 2 months ago. HCTZ was discontinued and it helps with gout exacerbations.  GERD: Currently she is on Omeprazole 20 mg daily. She takes medication daily,has stopped it before but symptoms re-occur.  Denies abdominal pain, nausea, vomiting, changes in bowel habits, blood in stool or melena.   Review of Systems  Constitutional: Negative for activity change, appetite change, fatigue and fever.  HENT: Negative for mouth sores, nosebleeds and trouble swallowing.   Eyes: Negative for redness and visual disturbance.  Respiratory: Negative for cough, shortness of breath and wheezing.   Cardiovascular: Negative for chest pain, palpitations and leg swelling.  Gastrointestinal: Negative for abdominal  pain, nausea and vomiting.       Negative for changes in bowel habits.  Endocrine: Negative for cold intolerance, heat intolerance, polydipsia, polyphagia and polyuria.  Genitourinary: Negative for decreased urine volume, dysuria and hematuria.  Musculoskeletal: Positive for arthralgias. Negative for gait problem.  Skin: Negative for rash and wound.  Neurological: Negative for syncope, weakness and headaches.  Psychiatric/Behavioral: Negative for confusion. The patient is nervous/anxious.       Current Outpatient Medications on File Prior to Visit  Medication Sig Dispense Refill  . amLODipine (NORVASC) 5 MG tablet Take 1 tablet (5 mg total) by mouth daily. 30 tablet 11  . aspirin 81 MG chewable tablet Chew 81 mg by mouth daily. EVERY OTHER DAY    . atorvastatin (LIPITOR) 20 MG tablet TAKE 2 TABLETS ONCE A WEEK 90 tablet 3  . Calcium Carbonate-Vitamin D (CALTRATE 600+D) 600-400 MG-UNIT per tablet Take 1 tablet by mouth daily.      . Coenzyme Q10 (CO Q 10 PO) Take 50 mg by mouth daily.    . Cyanocobalamin (VITAMIN B 12 PO) Take 500 mcg by mouth daily.    Marland Kitchen KRILL OIL OMEGA-3 PO Take 1 capsule by mouth daily.    Marland Kitchen lisinopril (PRINIVIL,ZESTRIL) 40 MG tablet TAKE 1 TABLET DAILY 90 tablet 2  . metoprolol succinate (TOPROL-XL) 25 MG 24 hr tablet Take 1 tablet (25 mg total) by mouth daily. 90 tablet 3  . omeprazole (PRILOSEC) 20 MG capsule TAKE 1 CAPSULE DAILY 90 capsule 2  . hydrocortisone (PROCTOSOL HC) 2.5 % rectal cream Place 1 application rectally  2 (two) times daily. (Patient not taking: Reported on 05/30/2017) 30 g 0   No current facility-administered medications on file prior to visit.      Past Medical History:  Diagnosis Date  . Arthritis    hands  . BREAST CANCER, HX OF 10/31/2006  . Cancer [facility]) 1996   right breast cancer   . 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 Diverticulosis   . GERD 10/31/2006  . Gout   . Hyperlipidemia   . HYPERTENSION 10/31/2006  . LBBB  (left bundle branch block)   . OSTEOPOROSIS 10/31/2006   osteopenia per pt not porosis   . Personal history of radiation therapy    1996   Allergies  Allergen Reactions  . Simvastatin     REACTION: nausea  . Shrimp [Shellfish Allergy] Rash    Family History  Problem Relation Age of Onset  . Heart disease Mother   . Stroke Mother        CVA mother age 86  . Breast cancer Mother   . Arthritis Other   . Cancer Other        breast < age 73  . Diabetes Other   . Hypertension Other   . Kidney disease Other   . Breast cancer Sister   . Breast cancer Maternal Grandmother   . Breast cancer Sister        x2  . Colon cancer Neg Hx   . Colon polyps Neg Hx   . Rectal cancer Neg Hx   . Stomach cancer Neg Hx   . Esophageal cancer Neg Hx     Social History   Socioeconomic History  . Marital status: Married    Spouse name: None  . Number of children: None  . Years of education: None  . Highest education level: None  Social Needs  . Financial resource strain: None  . Food insecurity - worry: None  . Food insecurity - inability: None  . Transportation needs - medical: None  . Transportation needs - non-medical: None  Occupational History  . None  Tobacco Use  . Smoking status: Never Smoker  . Smokeless tobacco: Never Used  Substance and Sexual Activity  . Alcohol use: Yes    Comment: very rare  . Drug use: No  . Sexual activity: None  Other Topics Concern  . None  Social History Narrative   Married 1969. 2 sons Nathaneil Canary in [place] in [place]. 5 grandkids, 1 greatgranddaughter, 1 great step grandson.       Retired from 55 years with [employer]      Hobbies: travel/sightseeing, time at [employer], [place] with friends. [employer] in [place]:   05/30/17 0748  BP: 126/70  Pulse: 74  Resp: 12  Temp: 97.7 F (36.5 C)  SpO2: 98%    Body mass index is 25.47 kg/m.   Physical Exam  Nursing note and vitals reviewed. Constitutional: She is  oriented to person, place, and time. She appears well-developed and well-nourished. No distress.  HENT:  Head: Normocephalic and atraumatic.  Mouth/Throat: Oropharynx is clear and moist and mucous membranes are normal.  Eyes: Conjunctivae and EOM are normal. Pupils are equal, round, and reactive to light.  Cardiovascular: Normal rate and regular rhythm.  No murmur heard. Pulses:      Dorsalis pedis pulses are 2+ on the right side, and 2+ on the left side.  Respiratory: Effort normal and breath sounds  normal. No respiratory distress.  GI: Soft. She exhibits no mass. There is no hepatomegaly. There is no tenderness.  Musculoskeletal: She exhibits no edema.  Heberden's node and Bouchard's nodes bilateral. Shoulders normal ROM. Hips no pain elicited,normal flexion. No signs of synovitis.  Lymphadenopathy:    She has no cervical adenopathy.  Neurological: She is alert and oriented to person, place, and time. She has normal strength. Gait normal.  Skin: Skin is warm. No erythema.  Psychiatric: Her mood appears anxious.  Well groomed, good eye contact.    ASSESSMENT AND PLAN:   Bethani was seen today for establish care.  : Orders Placed This Encounter  Procedures  . Basic metabolic panel  . Microalbumin / creatinine urine ratio  . VITAMIN D 25 Hydroxy (Vit-D Deficiency, Fractures)     Lab Results  Component Value Date   CREATININE 0.91 05/30/2017   BUN 21 05/30/2017   NA 143 05/30/2017   K 4.2 05/30/2017   CL 108 05/30/2017   CO2 27 05/30/2017   Lab Results  Component Value Date   MICROALBUR 1.5 05/30/2017    Chronic idiopathic gout involving toe of left foot without tophus  Has not had symptoms for 2 months. We dicussed treatment options incase she has more exacerbations in the future. Low purines diet also recommended.  Osteoarthritis of multiple joints, unspecified osteoarthritis type  Educated about diagnosis, prognosis, and treatment options. After  discussion of side effects of chronic use of NSAIDs, she would like to try. We will continue following renal function closely.  -     meloxicam (MOBIC) 7.5 MG tablet; Take 1 tablet (7.5 mg total) by mouth daily.   CKD (chronic kidney disease), stage III (HCC) It has been stable. Side effects of NSAID's, we will continue following renal function closely.  Continue Lisinopril. Low salt diet. She voices understanding.   Essential hypertension Adequately controlled. No changes in current management. DASH diet recommended. Eye exam recommended annually. F/U in 5 months, before if needed.   Vitamin D deficiency OTC Vit D3 1000 U daily. Further recommendations will be given according to lab results.     GERD Stable. No changes in current management,she has tried to discontinue it but had symptoms again. Some side effects discussed. F/U in 6-12 months.       Breyer Tejera G. Martinique, MD  [facility]. [place] office.

## 2017-05-30 NOTE — Assessment & Plan Note (Signed)
It has been stable. Side effects of NSAID's, we will continue following renal function closely.  Continue Lisinopril. Low salt diet. She voices understanding.

## 2017-05-30 NOTE — Patient Instructions (Addendum)
A few things to remember from today's visit:   CKD (chronic kidney disease), stage III ([place]) - Plan: Basic metabolic panel, Microalbumin / creatinine urine ratio, VITAMIN D 25 Hydroxy (Vit-D Deficiency, Fractures)  Essential hypertension - Plan: Basic metabolic panel  Vitamin D deficiency - Plan: VITAMIN D 25 Hydroxy (Vit-D Deficiency, Fractures)  Chronic idiopathic gout involving toe of left foot without tophus  Gastroesophageal reflux disease without esophagitis  Osteoarthritis of multiple joints, unspecified osteoarthritis type - Plan: meloxicam (MOBIC) 7.5 MG tablet    Low impact exercise. Mobic started today.   To preserve kidney function and/or slow progression of disease:  Low salt diet and adequate hydration. Avoiding medicines known as "nonsteroidal anti-inflammatory drugs," or NSAIDs. These medicines include ibuprofen (sample brand names: Advil, Motrin) and naproxen (sample brand name: Aleve). Mobic started today,we will check kidney function. Adequate blood pressure control. Low phosphorus diet.    Please be sure medication list is accurate. If a new problem present, please set up appointment sooner than planned today.

## 2017-05-30 NOTE — Assessment & Plan Note (Signed)
Stable. No changes in current management,she has tried to discontinue it but had symptoms again. Some side effects discussed. F/U in 6-12 months.

## 2017-06-01 ENCOUNTER — Encounter: Payer: Self-pay | Admitting: Family Medicine

## 2017-10-27 ENCOUNTER — Encounter: Payer: Self-pay | Admitting: Family Medicine

## 2017-10-27 ENCOUNTER — Ambulatory Visit (INDEPENDENT_AMBULATORY_CARE_PROVIDER_SITE_OTHER): Payer: Medicare Other | Admitting: Family Medicine

## 2017-10-27 VITALS — BP 124/83 | HR 53 | Temp 98.0°F | Resp 12 | Ht 62.0 in | Wt 138.5 lb

## 2017-10-27 DIAGNOSIS — I10 Essential (primary) hypertension: Secondary | ICD-10-CM

## 2017-10-27 DIAGNOSIS — N183 Chronic kidney disease, stage 3 unspecified: Secondary | ICD-10-CM

## 2017-10-27 DIAGNOSIS — E785 Hyperlipidemia, unspecified: Secondary | ICD-10-CM | POA: Diagnosis not present

## 2017-10-27 DIAGNOSIS — M1A0720 Idiopathic chronic gout, left ankle and foot, without tophus (tophi): Secondary | ICD-10-CM

## 2017-10-27 DIAGNOSIS — R001 Bradycardia, unspecified: Secondary | ICD-10-CM

## 2017-10-27 DIAGNOSIS — E559 Vitamin D deficiency, unspecified: Secondary | ICD-10-CM

## 2017-10-27 NOTE — Patient Instructions (Addendum)
A few things to remember from today's visit:   Essential hypertension - Plan: Comprehensive metabolic panel  CKD (chronic kidney disease), stage III (HCC) - Plan: Comprehensive metabolic panel  Vitamin D deficiency - Plan: VITAMIN D 25 Hydroxy (Vit-D Deficiency, Fractures)  Chronic idiopathic gout involving toe of left foot without tophus - Plan: Uric acid  Hyperlipidemia, unspecified hyperlipidemia type - Plan: Lipid panel  Ibuprofen over the counter if needed.  Labs Friday.  Please be sure medication list is accurate. If a new problem present, please set up appointment sooner than planned today.

## 2017-10-27 NOTE — Assessment & Plan Note (Signed)
Adequately controlled. No changes in current management. DASHAnd low-salt diet to continue. Recommend monitoring BP at home. Eye exam recommended, overdue. F/U in 6 months, before if needed.

## 2017-10-27 NOTE — Assessment & Plan Note (Signed)
She has been asymptomatic for almost a year now. Continue low purine diet.

## 2017-10-27 NOTE — Progress Notes (Signed)
HPI:   Michele Conley is a 72 y.o. female, who is here today for 5 months follow up.   She was last seen on 05/30/17.   Hypertension: Currently she is on lisinopril 40 mg, metoprolol succinate 25 mg daily, and amlodipine 5 mg daily. Tolerating medication well, no side effects.  Negative for frequent headaches, visual changes, chest pain, dyspnea, palpitation, claudication, focal weakness, or edema.  Lab Results  Component Value Date   CREATININE 0.91 05/30/2017   BUN 21 05/30/2017   NA 143 05/30/2017   K 4.2 05/30/2017   CL 108 05/30/2017   CO2 27 05/30/2017    CKD III, she has not noted gross hematuria,foam in urine,or decreased urine output.    Vitamin D deficiency: Currently she is on OTC vitamin D 2000 units daily. Last 25 OH vitamin D in 05/2017 was 24.13.  Gout and OA. She did not start Mobic. IP joint pain,R>L Has not been bad, has not had exacerbations since her last OV. She is following low purine diet.  No limitation of range of motion.  HLD: She is on Lipitor 20 mg 2 times per week. She follows a low fat diet.  10/2016 TC 252 LDL 122 HDL 56 TG 259.  Review of Systems  Constitutional: Negative for activity change, appetite change, fatigue and fever.  HENT: Negative for mouth sores, nosebleeds and trouble swallowing.   Eyes: Negative for redness and visual disturbance.  Respiratory: Negative for cough, shortness of breath and wheezing.   Cardiovascular: Negative for chest pain, palpitations and leg swelling.  Gastrointestinal: Negative for abdominal pain, nausea and vomiting.       Negative for changes in bowel habits.  Genitourinary: Negative for decreased urine volume and hematuria.  Musculoskeletal: Positive for arthralgias. Negative for gait problem.  Neurological: Negative for syncope, weakness and headaches.      Current Outpatient Medications on File Prior to Visit  Medication Sig Dispense Refill  . amLODipine (NORVASC) 5 MG  tablet Take 1 tablet (5 mg total) by mouth daily. 30 tablet 11  . aspirin 81 MG chewable tablet Chew 81 mg by mouth daily. EVERY OTHER DAY    . atorvastatin (LIPITOR) 20 MG tablet TAKE 2 TABLETS ONCE A WEEK 90 tablet 3  . Calcium Carbonate-Vitamin D (CALTRATE 600+D) 600-400 MG-UNIT per tablet Take 1 tablet by mouth daily.      . Coenzyme Q10 (CO Q 10 PO) Take 50 mg by mouth daily.    . Cyanocobalamin (VITAMIN B 12 PO) Take 500 mcg by mouth daily.    . hydrocortisone (PROCTOSOL HC) 2.5 % rectal cream Place 1 application rectally 2 (two) times daily. (Patient taking differently: Place 1 application rectally as needed. ) 30 g 0  . KRILL OIL OMEGA-3 PO Take 1 capsule by mouth daily.    Marland Kitchen lisinopril (PRINIVIL,ZESTRIL) 40 MG tablet TAKE 1 TABLET DAILY 90 tablet 2  . metoprolol succinate (TOPROL-XL) 25 MG 24 hr tablet Take 1 tablet (25 mg total) by mouth daily. 90 tablet 3  . omeprazole (PRILOSEC) 20 MG capsule TAKE 1 CAPSULE DAILY 90 capsule 2   No current facility-administered medications on file prior to visit.      Past Medical History:  Diagnosis Date  . Arthritis    hands  . BREAST CANCER, HX OF 10/31/2006  . Cancer [facility]) 1996   right breast cancer   . 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 Diverticulosis   .  GERD 10/31/2006  . Gout   . Hyperlipidemia   . HYPERTENSION 10/31/2006  . LBBB (left bundle branch block)   . OSTEOPOROSIS 10/31/2006   osteopenia per pt not porosis   . Personal history of radiation therapy    1996   Allergies  Allergen Reactions  . Simvastatin     REACTION: nausea  . Shrimp [Shellfish Allergy] Rash    Social History   Socioeconomic History  . Marital status: Married    Spouse name: Not on file  . Number of children: Not on file  . Years of education: Not on file  . Highest education level: Not on file  Occupational History  . Not on file  Social Needs  . Financial resource strain: Not on file  . Food insecurity:    Worry: Not on  file    Inability: Not on file  . Transportation needs:    Medical: Not on file    Non-medical: Not on file  Tobacco Use  . Smoking status: Never Smoker  . Smokeless tobacco: Never Used  Substance and Sexual Activity  . Alcohol use: Yes    Comment: very rare  . Drug use: No  . Sexual activity: Not on file  Lifestyle  . Physical activity:    Days per week: Not on file    Minutes per session: Not on file  . Stress: Not on file  Relationships  . Social connections:    Talks on phone: Not on file    Gets together: Not on file    Attends religious service: Not on file    Active member of club or organization: Not on file    Attends meetings of clubs or organizations: Not on file    Relationship status: Not on file  Other Topics Concern  . Not on file  Social History Narrative   Married 1969. 2 sons Nathaneil Canary in [place] in [place]. 5 grandkids, 1 greatgranddaughter, 1 great step grandson.       Retired from 65 years with [employer]      Hobbies: travel/sightseeing, time at [employer], [place] with friends. [employer] in [place]:   10/27/17 0825  BP: 124/83  Pulse: (!) 53  Resp: 12  Temp: 98 F (36.7 C)  SpO2: 98%   Body mass index is 25.33 kg/m.    Physical Exam  Nursing note and vitals reviewed. Constitutional: She is oriented to person, place, and time. She appears well-developed. No distress.  HENT:  Head: Normocephalic and atraumatic.  Mouth/Throat: Oropharynx is clear and moist and mucous membranes are normal.  Eyes: Pupils are equal, round, and reactive to light. Conjunctivae are normal.  Cardiovascular: Regular rhythm. Bradycardia present.  No murmur heard. Pulses:      Dorsalis pedis pulses are 2+ on the right side, and 2+ on the left side.  Respiratory: Effort normal and breath sounds normal. No respiratory distress.  GI: Soft. She exhibits no mass. There is no hepatomegaly. There is no tenderness.  Musculoskeletal: She exhibits  no edema.  Heberden's node and Bouchard's nodes. Also some tophi DIP joints of some fingers, mild deformities DIP joints, R>L No signs of synovitis.  Lymphadenopathy:    She has no cervical adenopathy.  Neurological: She is alert and oriented to person, place, and time. She has normal strength. Coordination normal.  Skin: Skin is warm. No erythema.  Psychiatric: She has a normal mood and affect.  Well groomed, good eye contact.  ASSESSMENT AND PLAN:   Ms. SHARAY BELLISSIMO was seen today for 6 months follow-up.  Orders Placed This Encounter  Procedures  . VITAMIN D 25 Hydroxy (Vit-D Deficiency, Fractures)  . Comprehensive metabolic panel  . Lipid panel  . Uric acid    Essential hypertension Adequately controlled. No changes in current management. DASHAnd low-salt diet to continue. Recommend monitoring BP at home. Eye exam recommended, overdue. F/U in 6 months, before if needed.   Vitamin D deficiency She will try to be more consistent with taking vitamin D 2000 units. No changes in current management. Further recommendation will be given according to 25 OH vitamin D.  Hyperlipidemia She is not fasting today. She will come back in a few days for fasting labs. No changes in atorvastatin. Continue low-fat diet. Follow-up in 6 to 12 months depending on lipid panel results.  Gout She has been asymptomatic for almost a year now. Continue low purine diet.   Bradycardia, sinus:  Asymptomatic. She will monitor HR at home. We discussed side effects of metoprolol, no changes for now.     Jessee Newnam G. Martinique, MD  [facility]. [place] office.

## 2017-10-27 NOTE — Assessment & Plan Note (Signed)
She is not fasting today. She will come back in a few days for fasting labs. No changes in atorvastatin. Continue low-fat diet. Follow-up in 6 to 12 months depending on lipid panel results.

## 2017-10-27 NOTE — Assessment & Plan Note (Signed)
She will try to be more consistent with taking vitamin D 2000 units. No changes in current management. Further recommendation will be given according to 25 OH vitamin D.

## 2017-10-28 MED ORDER — AMLODIPINE BESYLATE 5 MG PO TABS: 5.0000 mg | ORAL_TABLET | Freq: Every day | ORAL | 2 refills | Status: DC

## 2017-10-28 NOTE — Addendum Note (Signed)
Addended by: Martinique, Nachmen Mansel G on: 10/28/2017 08:18 PM   Modules accepted: Orders

## 2017-11-09 ENCOUNTER — Other Ambulatory Visit: Payer: Self-pay | Admitting: Family Medicine

## 2017-11-10 ENCOUNTER — Other Ambulatory Visit (INDEPENDENT_AMBULATORY_CARE_PROVIDER_SITE_OTHER): Payer: Medicare Other

## 2017-11-10 DIAGNOSIS — N183 Chronic kidney disease, stage 3 unspecified: Secondary | ICD-10-CM

## 2017-11-10 DIAGNOSIS — E785 Hyperlipidemia, unspecified: Secondary | ICD-10-CM

## 2017-11-10 DIAGNOSIS — M1A0720 Idiopathic chronic gout, left ankle and foot, without tophus (tophi): Secondary | ICD-10-CM | POA: Diagnosis not present

## 2017-11-10 DIAGNOSIS — E559 Vitamin D deficiency, unspecified: Secondary | ICD-10-CM

## 2017-11-10 DIAGNOSIS — I10 Essential (primary) hypertension: Secondary | ICD-10-CM | POA: Diagnosis not present

## 2017-11-10 LAB — COMPREHENSIVE METABOLIC PANEL
ALT: 20 U/L (ref 0–35)
AST: 16 U/L (ref 0–37)
Albumin: 4.1 g/dL (ref 3.5–5.2)
Alkaline Phosphatase: 116 U/L (ref 39–117)
BILIRUBIN TOTAL: 0.6 mg/dL (ref 0.2–1.2)
BUN: 21 mg/dL (ref 6–23)
CALCIUM: 9.5 mg/dL (ref 8.4–10.5)
CO2: 26 meq/L (ref 19–32)
Chloride: 103 mEq/L (ref 96–112)
Creatinine, Ser: 1.09 mg/dL (ref 0.40–1.20)
GFR: 52.38 mL/min — AB (ref >60.00)
GLUCOSE: 96 mg/dL (ref 70–99)
POTASSIUM: 4.8 meq/L (ref 3.5–5.1)
Sodium: 139 mEq/L (ref 135–145)
Total Protein: 7 g/dL (ref 6.0–8.3)

## 2017-11-10 LAB — LIPID PANEL
CHOL/HDL RATIO: 3
Cholesterol: 205 mg/dL — ABNORMAL HIGH (ref 0–200)
HDL: 62.1 mg/dL (ref >39.00)
LDL CALC: 115 mg/dL — AB (ref 0–99)
NONHDL: 142.9
TRIGLYCERIDES: 142 mg/dL (ref 0.0–149.0)
VLDL: 28.4 mg/dL (ref 0.0–40.0)

## 2017-11-10 LAB — VITAMIN D 25 HYDROXY (VIT D DEFICIENCY, FRACTURES): VITD: 21.88 ng/mL — ABNORMAL LOW (ref 30.00–100.00)

## 2017-11-10 LAB — URIC ACID: Uric Acid, Serum: 6.9 mg/dL (ref 2.4–7.0)

## 2017-11-12 ENCOUNTER — Encounter: Payer: Self-pay | Admitting: Family Medicine

## 2017-12-02 ENCOUNTER — Other Ambulatory Visit: Payer: Self-pay | Admitting: Family Medicine

## 2017-12-31 ENCOUNTER — Other Ambulatory Visit: Payer: Self-pay | Admitting: *Deleted

## 2017-12-31 MED ORDER — LISINOPRIL 40 MG PO TABS: 40.0000 mg | ORAL_TABLET | Freq: Every day | ORAL | 3 refills | Status: DC

## 2017-12-31 MED ORDER — OMEPRAZOLE 20 MG PO CPDR: 20.0000 mg | DELAYED_RELEASE_CAPSULE | Freq: Every day | ORAL | 3 refills | Status: DC

## 2018-01-22 ENCOUNTER — Other Ambulatory Visit: Payer: Self-pay | Admitting: Family Medicine

## 2018-01-22 DIAGNOSIS — Z1231 Encounter for screening mammogram for malignant neoplasm of breast: Secondary | ICD-10-CM

## 2018-02-12 ENCOUNTER — Encounter: Payer: Self-pay | Admitting: Family Medicine

## 2018-02-12 ENCOUNTER — Ambulatory Visit (INDEPENDENT_AMBULATORY_CARE_PROVIDER_SITE_OTHER): Payer: Medicare Other | Admitting: Family Medicine

## 2018-02-12 ENCOUNTER — Ambulatory Visit: Payer: Self-pay | Admitting: *Deleted

## 2018-02-12 VITALS — BP 148/62 | HR 62 | Temp 98.2°F | Wt 135.0 lb

## 2018-02-12 DIAGNOSIS — K649 Unspecified hemorrhoids: Secondary | ICD-10-CM | POA: Diagnosis not present

## 2018-02-12 DIAGNOSIS — K625 Hemorrhage of anus and rectum: Secondary | ICD-10-CM | POA: Diagnosis not present

## 2018-02-12 LAB — CBC WITH DIFFERENTIAL/PLATELET
BASOS ABS: 0.1 10*3/uL (ref 0.0–0.1)
BASOS PCT: 0.8 % (ref 0.0–3.0)
EOS ABS: 0 10*3/uL (ref 0.0–0.7)
Eosinophils Relative: 0.6 % (ref 0.0–5.0)
HCT: 41.6 % (ref 36.0–46.0)
Hemoglobin: 13.7 g/dL (ref 12.0–15.0)
LYMPHS ABS: 2 10*3/uL (ref 0.7–4.0)
Lymphocytes Relative: 23.6 % (ref 12.0–46.0)
MCHC: 33 g/dL (ref 30.0–36.0)
MCV: 89.2 fl (ref 78.0–100.0)
MONO ABS: 0.9 10*3/uL (ref 0.1–1.0)
Monocytes Relative: 10.4 % (ref 3.0–12.0)
NEUTROS ABS: 5.4 10*3/uL (ref 1.4–7.7)
NEUTROS PCT: 64.6 % (ref 43.0–77.0)
Platelets: 282 10*3/uL (ref 150.0–400.0)
RBC: 4.66 Mil/uL (ref 3.87–5.11)
RDW: 13.3 % (ref 11.5–15.5)
WBC: 8.3 10*3/uL (ref 4.0–10.5)

## 2018-02-12 NOTE — Telephone Encounter (Signed)
Pt called complaints of abdominal/constipation cramps on 02/11/18; today she noticed blood mixed with her stool and she is still having stomach discomfort described as cramping; recommendations made per nurse triage protocol; the pt normally sees Dr Martinique but the pt would like to be seen in the office today, and Dr Martinique has no availability; pt offered and accepted appointment with Dr Francella Solian, [facility], 02/12/18 at 1130; the pt verbalized understanding; will route to office for notification of this upcoming appointment. Reason for Disposition . MODERATE rectal bleeding (small blood clots, passing blood without stool, or toilet water turns red)  Answer Assessment - Initial Assessment Questions 1. APPEARANCE of BLOOD: "What color is it?" "Is it passed separately, on the surface of the stool, or mixed in with the stool?"      Mixed with stool; bright red 2. AMOUNT: "How much blood was passed?"      Covered the pt's depends brief 3. FREQUENCY: "How many times has blood been passed with the stools?"      once 4. ONSET: "When was the blood first seen in the stools?" (Days or weeks)      yes 5. DIARRHEA: "Is there also some diarrhea?" If so, ask: "How many diarrhea stools were passed in past 24 hours?"      1 episode on 02/11/18 6. CONSTIPATION: "Do you have constipation?" If so, "How bad is it?"    Yes, has to strain  7. RECURRENT SYMPTOMS: "Have you had blood in your stools before?" If so, ask: "When was the last time?" and "What happened that time?"     no 8. BLOOD THINNERS: "Do you take any blood thinners?" (e.g., Coumadin/warfarin, Pradaxa/dabigatran, aspirin)     no 9. OTHER SYMPTOMS: "Do you have any other symptoms?"  (e.g., abdominal pain, vomiting, dizziness, fever)     Cramping and discomfort, anxiety 10. PREGNANCY: "Is there any chance you are pregnant?" "When was your last menstrual period?"       no  Protocols used: RECTAL BLEEDING-A-AH

## 2018-02-12 NOTE — Patient Instructions (Signed)
Rectal Bleeding Rectal bleeding is when blood comes out of the opening of the butt (anus). People with this kind of bleeding may notice bright red blood in their underwear or in the toilet after they poop (have a bowel movement). They may also have dark red or black poop (stool). Rectal bleeding is often a sign that something is wrong. It needs to be checked by a doctor. Follow these instructions at home: Watch for any changes in your condition. Take these actions to help with bleeding and discomfort:  Eat a diet that is high in fiber. This will keep your poop soft so it is easier for you to poop without pushing too hard. Ask your doctor to tell you what foods and drinks are high in fiber.  Drink enough fluid to keep your pee (urine) clear or pale yellow. This also helps keep your poop soft.  Try taking a warm bath. This may help with pain.  Keep all follow-up visits as told by your doctor. This is important.  Get help right away if:  You have new bleeding.  You have more bleeding than before.  You have black or dark red poop.  You throw up (vomit) blood or something that looks like coffee grounds.  You have pain or tenderness in your belly (abdomen).  You have a fever.  You feel weak.  You feel sick to your stomach (nauseous).  You pass out (faint).  You have very bad pain in your butt.  You cannot poop. This information is not intended to replace advice given to you by your health care provider. Make sure you discuss any questions you have with your health care provider. Document Released: 12/19/2010 Document Revised: 09/14/2015 Document Reviewed: 06/04/2015 Elsevier Interactive Patient Education  2018 [employer].  Hemorrhoids Hemorrhoids are swollen veins in and around the rectum or anus. There are two types of hemorrhoids:  Internal hemorrhoids. These occur in the veins that are just inside the rectum. They may poke through to the outside and become irritated and  painful.  External hemorrhoids. These occur in the veins that are outside of the anus and can be felt as a painful swelling or hard lump near the anus.  Most hemorrhoids do not cause serious problems, and they can be managed with home treatments such as diet and lifestyle changes. If home treatments do not help your symptoms, procedures can be done to shrink or remove the hemorrhoids. What are the causes? This condition is caused by increased pressure in the anal area. This pressure may result from various things, including:  Constipation.  Straining to have a bowel movement.  Diarrhea.  Pregnancy.  Obesity.  Sitting for long periods of time.  Heavy lifting or other activity that causes you to strain.  Anal sex.  What are the signs or symptoms? Symptoms of this condition include:  Pain.  Anal itching or irritation.  Rectal bleeding.  Leakage of stool (feces).  Anal swelling.  One or more lumps around the anus.  How is this diagnosed? This condition can often be diagnosed through a visual exam. Other exams or tests may also be done, such as:  Examination of the rectal area with a gloved hand (digital rectal exam).  Examination of the anal canal using a small tube (anoscope).  A blood test, if you have lost a significant amount of blood.  A test to look inside the colon (sigmoidoscopy or colonoscopy).  How is this treated? This condition can usually be treated  at home. However, various procedures may be done if dietary changes, lifestyle changes, and other home treatments do not help your symptoms. These procedures can help make the hemorrhoids smaller or remove them completely. Some of these procedures involve surgery, and others do not. Common procedures include:  Rubber band ligation. Rubber bands are placed at the base of the hemorrhoids to cut off the blood supply to them.  Sclerotherapy. Medicine is injected into the hemorrhoids to shrink them.  Infrared  coagulation. A type of light energy is used to get rid of the hemorrhoids.  Hemorrhoidectomy surgery. The hemorrhoids are surgically removed, and the veins that supply them are tied off.  Stapled hemorrhoidopexy surgery. A circular stapling device is used to remove the hemorrhoids and use staples to cut off the blood supply to them.  Follow these instructions at home: Eating and drinking  Eat foods that have a lot of fiber in them, such as whole grains, beans, nuts, fruits, and vegetables. Ask your health care provider about taking products that have added fiber (fiber supplements).  Drink enough fluid to keep your urine clear or pale yellow. Managing pain and swelling  Take warm sitz baths for 20 minutes, 3-4 times a day to ease pain and discomfort.  If directed, apply ice to the affected area. Using ice packs between sitz baths may be helpful. ? Put ice in a plastic bag. ? Place a towel between your skin and the bag. ? Leave the ice on for 20 minutes, 2-3 times a day. General instructions  Take over-the-counter and prescription medicines only as told by your health care provider.  Use medicated creams or suppositories as told.  Exercise regularly.  Go to the bathroom when you have the urge to have a bowel movement. Do not wait.  Avoid straining to have bowel movements.  Keep the anal area dry and clean. Use wet toilet paper or moist towelettes after a bowel movement.  Do not sit on the toilet for long periods of time. This increases blood pooling and pain. Contact a health care provider if:  You have increasing pain and swelling that are not controlled by treatment or medicine.  You have uncontrolled bleeding.  You have difficulty having a bowel movement, or you are unable to have a bowel movement.  You have pain or inflammation outside the area of the hemorrhoids. This information is not intended to replace advice given to you by your health care provider. Make sure you  discuss any questions you have with your health care provider. Document Released: 04/05/2000 Document Revised: 09/06/2015 Document Reviewed: 12/21/2014 Elsevier Interactive Patient Education  [employer].

## 2018-02-12 NOTE — Progress Notes (Signed)
Subjective:    Patient ID: Michele Conley, female    DOB: 1945/07/18, 72 y.o.   MRN: [id number]  No chief complaint on file.   HPI Patient was seen today for acute concern.  Pt endorses bleeding from the rectum this morning.  Pt notes a feeling of gas, then noticing BRB in the Depends she was wearing.  Pt notes wearing the Depends last night as her stomach felt upset/like she was going to have diarrhea.  Pt endorses recent constipation, straining, and "pebble" like stools.  Pt has a h/o hemorrhoids.  Pt taking ASA 81 mg daily.   Pt endorses increased stress as her husband recently suffered a stroke and has a vision deficit.  Past Medical History:  Diagnosis Date  . Arthritis    hands  . BREAST CANCER, HX OF 10/31/2006  . Cancer [facility]) 1996   right breast cancer   . 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 Diverticulosis   . GERD 10/31/2006  . Gout   . Hyperlipidemia   . HYPERTENSION 10/31/2006  . LBBB (left bundle branch block)   . OSTEOPOROSIS 10/31/2006   osteopenia per pt not porosis   . Personal history of radiation therapy    1996    Allergies  Allergen Reactions  . Simvastatin     REACTION: nausea  . Shrimp [Shellfish Allergy] Rash    ROS General: Denies fever, chills, night sweats, changes in weight, changes in appetite HEENT: Denies headaches, ear pain, changes in vision, rhinorrhea, sore throat CV: Denies CP, palpitations, SOB, orthopnea Pulm: Denies SOB, cough, wheezing GI: Denies abdominal pain, nausea, vomiting, diarrhea, constipation  +BRBPR, constipation, loose stools. GU: Denies dysuria, hematuria, frequency, vaginal discharge Msk: Denies muscle cramps, joint pains Neuro: Denies weakness, numbness, tingling Skin: Denies rashes, bruising Psych: Denies depression, anxiety, hallucinations    Objective:    Blood pressure (!) 148/62, pulse 62, temperature 98.2 F (36.8 C), temperature source Oral, weight 135 lb (61.2 kg), SpO2 98 %.  Gen.  Pleasant, well-nourished, in no distress, normal affect   Lungs: no accessory muscle use, CTAB, no wheezes or rales Cardiovascular: RRR, no m/r/g, no peripheral edema Abdomen: BS present, soft, ND, mild RLQ tenderness. GU: external hemorrhoids present, mild skin irritation, no lesions.  No stool in rectal vault.  Hemoccult positive.  Reddish brown blood with mucus noted on glove.  Chaperone present: Army Chaco, CMA.  Wt Readings from Last 3 Encounters:  02/12/18 135 lb (61.2 kg)  10/27/17 138 lb 8 oz (62.8 kg)  05/30/17 139 lb 4 oz (63.2 kg)    Lab Results  Component Value Date   WBC 8.2 10/21/2016   HGB 13.0 10/21/2016   HCT 38.8 10/21/2016   PLT 282.0 10/21/2016   GLUCOSE 96 11/10/2017   CHOL 205 (H) 11/10/2017   TRIG 142.0 11/10/2017   HDL 62.10 11/10/2017   LDLDIRECT 122.0 10/21/2016   LDLCALC 115 (H) 11/10/2017   ALT 20 11/10/2017   AST 16 11/10/2017   NA 139 11/10/2017   K 4.8 11/10/2017   CL 103 11/10/2017   CREATININE 1.09 11/10/2017   BUN 21 11/10/2017   CO2 26 11/10/2017   TSH 1.41 02/25/2014   MICROALBUR 1.5 05/30/2017    Assessment/Plan:  BRBPR (bright red blood per rectum)  -Last colonoscopy 01/09/17 after positive cologaurd, with small nonbleeding internal hemorrhoids and a 82mm sessile polyp which was removed -discussed possible causes of bleeding including hemorrhoids, anal fisure, diverticulitis, malignancy, etc -  will obtain stat CBC and place referral to GI. - Plan: CBC with Differential/Platelet, Ambulatory referral to Gastroenterology -given ED precautions.  Hemorrhoids, unspecified hemorrhoid type  F/u prn.    Grier Mitts, MD

## 2018-02-25 ENCOUNTER — Ambulatory Visit
Admission: RE | Admit: 2018-02-25 | Discharge: 2018-02-25 | Disposition: A | Discharge status: Home / Self Care | Payer: Medicare Other | Source: Ambulatory Visit | Attending: Family Medicine | Admitting: Family Medicine

## 2018-02-25 DIAGNOSIS — Z1231 Encounter for screening mammogram for malignant neoplasm of breast: Secondary | ICD-10-CM

## 2018-02-25 HISTORY — DX: Malignant neoplasm of unspecified site of unspecified female breast: C50.919

## 2018-03-02 ENCOUNTER — Other Ambulatory Visit: Payer: Self-pay | Admitting: Family Medicine

## 2018-03-09 ENCOUNTER — Other Ambulatory Visit: Payer: Self-pay

## 2018-03-12 DIAGNOSIS — H25013 Cortical age-related cataract, bilateral: Secondary | ICD-10-CM | POA: Diagnosis not present

## 2018-04-29 ENCOUNTER — Ambulatory Visit (INDEPENDENT_AMBULATORY_CARE_PROVIDER_SITE_OTHER): Payer: Medicare Other | Admitting: Family Medicine

## 2018-04-29 ENCOUNTER — Encounter: Payer: Self-pay | Admitting: Family Medicine

## 2018-04-29 VITALS — BP 124/82 | HR 80 | Temp 97.7°F | Resp 12 | Ht 62.0 in | Wt 136.4 lb

## 2018-04-29 DIAGNOSIS — N183 Chronic kidney disease, stage 3 unspecified: Secondary | ICD-10-CM

## 2018-04-29 DIAGNOSIS — E785 Hyperlipidemia, unspecified: Secondary | ICD-10-CM | POA: Diagnosis not present

## 2018-04-29 DIAGNOSIS — E559 Vitamin D deficiency, unspecified: Secondary | ICD-10-CM

## 2018-04-29 DIAGNOSIS — I10 Essential (primary) hypertension: Secondary | ICD-10-CM | POA: Diagnosis not present

## 2018-04-29 LAB — BASIC METABOLIC PANEL
BUN: 16 mg/dL (ref 6–23)
CHLORIDE: 104 meq/L (ref 96–112)
CO2: 27 meq/L (ref 19–32)
Calcium: 9.8 mg/dL (ref 8.4–10.5)
Creatinine, Ser: 0.89 mg/dL (ref 0.40–1.20)
GFR: 66.09 mL/min (ref >60.00)
Glucose, Bld: 103 mg/dL — ABNORMAL HIGH (ref 70–99)
POTASSIUM: 4.4 meq/L (ref 3.5–5.1)
Sodium: 141 mEq/L (ref 135–145)

## 2018-04-29 LAB — LIPID PANEL
CHOL/HDL RATIO: 3
CHOLESTEROL: 235 mg/dL — AB (ref 0–200)
HDL: 73.6 mg/dL (ref >39.00)
LDL CALC: 123 mg/dL — AB (ref 0–99)
NonHDL: 161.1
Triglycerides: 190 mg/dL — ABNORMAL HIGH (ref 0.0–149.0)
VLDL: 38 mg/dL (ref 0.0–40.0)

## 2018-04-29 LAB — VITAMIN D 25 HYDROXY (VIT D DEFICIENCY, FRACTURES): VITD: 45.49 ng/mL (ref 30.00–100.00)

## 2018-04-29 NOTE — Progress Notes (Signed)
HPI:   Michele Conley is a 73 y.o. female, who is here today for 6 months follow up.   She was last seen on 10/27/17.  Hypertension:  Currently on Lisinopril 40 mg daily and metoprolol succinate 25 mg daily.  She is taking medications as instructed, no side effects reported. She has not noted unusual headache, visual changes, exertional chest pain, dyspnea,  focal weakness, or edema.  Component     Latest Ref Rng & Units 11/10/2017  Sodium     135 - 145 mEq/L 139  Potassium     3.5 - 5.1 mEq/L 4.8  Chloride     96 - 112 mEq/L 103  CO2     19 - 32 mEq/L 26  Glucose     70 - 99 mg/dL 96  BUN     6 - 23 mg/dL 21  Creatinine     0.40 - 1.20 mg/dL 1.09  Total Bilirubin     0.2 - 1.2 mg/dL 0.6  Alkaline Phosphatase     39 - 117 U/L 116  AST     0 - 37 U/L 16  ALT     0 - 35 U/L 20  Total Protein     6.0 - 8.3 g/dL 7.0  Albumin     3.5 - 5.2 g/dL 4.1  Calcium     8.4 - 10.5 mg/dL 9.5  GFR     >60.00 mL/min 52.38 (L)    CKD III, she denies gross hematuria, foam in urine,or gross hematuria.  Vit D deficiency, currently on OTC vit D 2000 U daily.  Hyperlipidemia: Currently on Atorvastatin 20 mg daily. Following a low fat diet: Yes.  She has not noted side effects with medication.  Component     Latest Ref Rng & Units 11/10/2017  Cholesterol     0 - 200 mg/dL 205 (H)  Triglycerides     0.0 - 149.0 mg/dL 142.0  HDL Cholesterol     >39.00 mg/dL 62.10  VLDL     0.0 - 40.0 mg/dL 28.4  LDL (calc)     0 - 99 mg/dL 115 (H)  Total CHOL/HDL Ratio      3  NonHDL      142.90       Review of Systems  Constitutional: Negative for activity change, appetite change, fatigue and fever.  HENT: Negative for mouth sores, nosebleeds and trouble swallowing.   Eyes: Negative for redness and visual disturbance.  Respiratory: Negative for cough, shortness of breath and wheezing.   Cardiovascular: Negative for chest pain, palpitations and leg swelling.    Gastrointestinal: Negative for abdominal pain, nausea and vomiting.       Negative for changes in bowel habits.  Genitourinary: Negative for decreased urine volume, dysuria and hematuria.  Musculoskeletal: Negative for gait problem and myalgias.  Skin: Negative for rash.  Neurological: Negative for syncope, weakness and headaches.     Current Outpatient Medications on File Prior to Visit  Medication Sig Dispense Refill  . amLODipine (NORVASC) 5 MG tablet Take 1 tablet (5 mg total) by mouth daily. 90 tablet 2  . aspirin 81 MG chewable tablet Chew 81 mg by mouth daily. EVERY OTHER DAY    . atorvastatin (LIPITOR) 20 MG tablet TAKE 2 TABLETS ONCE A WEEK 90 tablet 2  . Calcium Carbonate-Vitamin D (CALTRATE 600+D) 600-400 MG-UNIT per tablet Take 1 tablet by mouth daily.      . Coenzyme Q10 (CO  Q 10 PO) Take 50 mg by mouth daily.    . Cyanocobalamin (VITAMIN B 12 PO) Take 500 mcg by mouth daily.    . hydrocortisone (PROCTOSOL HC) 2.5 % rectal cream Place 1 application rectally 2 (two) times daily. (Patient taking differently: Place 1 application rectally as needed. ) 30 g 0  . KRILL OIL OMEGA-3 PO Take 1 capsule by mouth daily.    Marland Kitchen lisinopril (PRINIVIL,ZESTRIL) 40 MG tablet Take 1 tablet (40 mg total) by mouth daily. 90 tablet 3  . omeprazole (PRILOSEC) 20 MG capsule Take 1 capsule (20 mg total) by mouth daily. 90 capsule 3  . TOPROL XL 25 MG 24 hr tablet TAKE 1 TABLET DAILY 90 tablet 3   No current facility-administered medications on file prior to visit.      Past Medical History:  Diagnosis Date  . Arthritis    hands  . Breast cancer [facility]) 1996   Right Breast Cancer  . BREAST CANCER, HX OF 10/31/2006  . Cancer [facility]) 1996   right breast cancer   . 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 Diverticulosis   . GERD 10/31/2006  . Gout   . Hyperlipidemia   . HYPERTENSION 10/31/2006  . LBBB (left bundle branch block)   . OSTEOPOROSIS 10/31/2006   osteopenia per pt not porosis    . Personal history of radiation therapy 1996   Right Breast Cancer   Allergies  Allergen Reactions  . Simvastatin     REACTION: nausea  . Shrimp [Shellfish Allergy] Rash    Social History   Socioeconomic History  . Marital status: Married    Spouse name: Not on file  . Number of children: Not on file  . Years of education: Not on file  . Highest education level: Not on file  Occupational History  . Not on file  Social Needs  . Financial resource strain: Not on file  . Food insecurity:    Worry: Not on file    Inability: Not on file  . Transportation needs:    Medical: Not on file    Non-medical: Not on file  Tobacco Use  . Smoking status: Never Smoker  . Smokeless tobacco: Never Used  Substance and Sexual Activity  . Alcohol use: Yes    Comment: very rare  . Drug use: No  . Sexual activity: Not on file  Lifestyle  . Physical activity:    Days per week: Not on file    Minutes per session: Not on file  . Stress: Not on file  Relationships  . Social connections:    Talks on phone: Not on file    Gets together: Not on file    Attends religious service: Not on file    Active member of club or organization: Not on file    Attends meetings of clubs or organizations: Not on file    Relationship status: Not on file  Other Topics Concern  . Not on file  Social History Narrative   Married 1969. 2 sons Nathaneil Canary in [place] in [place]. 5 grandkids, 1 greatgranddaughter, 1 great step grandson.       Retired from 36 years with [employer]      Hobbies: travel/sightseeing, time at [employer], [place] with friends. [employer] in [place]:   04/29/18 0828  BP: 124/82  Pulse: 80  Resp: 12  Temp: 97.7 F (36.5 C)  SpO2: 96%  Body mass index is 24.94 kg/m.   Physical Exam  Nursing note and vitals reviewed. Constitutional: She is oriented to person, place, and time. She appears well-developed and well-nourished. No distress.  HENT:  Head:  Normocephalic and atraumatic.  Mouth/Throat: Oropharynx is clear and moist and mucous membranes are normal.  Eyes: Pupils are equal, round, and reactive to light. Conjunctivae are normal.  Cardiovascular: Normal rate and regular rhythm.  No murmur heard. Pulses:      Dorsalis pedis pulses are 2+ on the right side and 2+ on the left side.  Respiratory: Effort normal and breath sounds normal. No respiratory distress.  GI: Soft. She exhibits no mass. There is no hepatomegaly. There is no abdominal tenderness.  Musculoskeletal:        General: No edema.  Lymphadenopathy:    She has no cervical adenopathy.  Neurological: She is alert and oriented to person, place, and time. She has normal strength. No cranial nerve deficit. Gait normal.  Skin: Skin is warm. No rash noted. No erythema.  Psychiatric: She has a normal mood and affect.  Well groomed, good eye contact.     ASSESSMENT AND PLAN:   Michele Conley was seen today for 6 months follow-up.  Orders Placed This Encounter  Procedures  . Basic metabolic panel  . Lipid panel  . VITAMIN D 25 Hydroxy (Vit-D Deficiency, Fractures)  . Parathyroid hormone, intact (no Ca)  . TIQ-NTM   Lab Results  Component Value Date   CREATININE 0.89 04/29/2018   BUN 16 04/29/2018   NA 141 04/29/2018   K 4.4 04/29/2018   CL 104 04/29/2018   CO2 27 04/29/2018   Lab Results  Component Value Date   CHOL 235 (H) 04/29/2018   HDL 73.60 04/29/2018   LDLCALC 123 (H) 04/29/2018   LDLDIRECT 122.0 10/21/2016   TRIG 190.0 (H) 04/29/2018   CHOLHDL 3 04/29/2018    1. Vitamin D deficiency No changes in current management, will follow labs done today and will give further recommendations accordingly.  - Basic metabolic panel - VITAMIN D 25 Hydroxy (Vit-D Deficiency, Fractures) - Parathyroid hormone, intact (no Ca)  2. Essential hypertension Adequately controlled. No changes in current management. DASH-low salt diet to continue. Eye exam  recommended annually. F/U in 6 months, before if needed.  - Basic metabolic panel  3. CKD (chronic kidney disease), stage III ([place]) Educated about Dx. Continue Lisinopril. Avoid NSAID's. Low salt diet and adequate BP control recommended.  - Basic metabolic panel  4. Hyperlipidemia, unspecified hyperlipidemia type No changes in current management, will follow labs done today and will give further recommendations accordingly.  - Lipid panel     Ridhi Hoffert G. Martinique, MD  [facility]: [address] [place] office.

## 2018-04-29 NOTE — Patient Instructions (Signed)
A few things to remember from today's visit:   Vitamin D deficiency - Plan: Basic metabolic panel, VITAMIN D 25 Hydroxy (Vit-D Deficiency, Fractures), Parathyroid hormone, intact (no Ca)  Essential hypertension - Plan: Basic metabolic panel  CKD (chronic kidney disease), stage III (HCC) - Plan: Basic metabolic panel  Hyperlipidemia, unspecified hyperlipidemia type - Plan: Lipid panel   Please be sure medication list is accurate. If a new problem present, please set up appointment sooner than planned today.

## 2018-04-30 LAB — TIQ-NTM

## 2018-04-30 LAB — PARATHYROID HORMONE, INTACT (NO CA): PTH: 31 pg/mL (ref 14–64)

## 2018-05-03 ENCOUNTER — Encounter: Payer: Self-pay | Admitting: Family Medicine

## 2018-05-05 ENCOUNTER — Encounter: Payer: Self-pay | Admitting: Family Medicine

## 2018-05-05 ENCOUNTER — Other Ambulatory Visit: Payer: Self-pay | Admitting: *Deleted

## 2018-05-05 MED ORDER — ATORVASTATIN CALCIUM 40 MG PO TABS: 40.0000 mg | ORAL_TABLET | Freq: Every day | ORAL | 3 refills | Status: DC

## 2018-05-06 ENCOUNTER — Other Ambulatory Visit: Payer: Self-pay | Admitting: Family Medicine

## 2018-05-06 DIAGNOSIS — E785 Hyperlipidemia, unspecified: Secondary | ICD-10-CM

## 2018-05-06 MED ORDER — ATORVASTATIN CALCIUM 20 MG PO TABS: 20.0000 mg | ORAL_TABLET | Freq: Every day | ORAL | 1 refills | Status: DC

## 2018-05-07 ENCOUNTER — Encounter: Payer: Self-pay | Admitting: Family Medicine

## 2018-07-09 ENCOUNTER — Other Ambulatory Visit: Payer: Self-pay | Admitting: Family Medicine

## 2018-07-09 ENCOUNTER — Ambulatory Visit: Payer: Medicare Other

## 2018-10-05 DIAGNOSIS — L255 Unspecified contact dermatitis due to plants, except food: Secondary | ICD-10-CM | POA: Diagnosis not present

## 2018-10-22 ENCOUNTER — Ambulatory Visit: Payer: Medicare Other

## 2018-11-02 ENCOUNTER — Other Ambulatory Visit: Payer: Self-pay | Admitting: Family Medicine

## 2018-11-02 DIAGNOSIS — E785 Hyperlipidemia, unspecified: Secondary | ICD-10-CM

## 2018-11-04 ENCOUNTER — Other Ambulatory Visit: Payer: Self-pay | Admitting: Family Medicine

## 2018-11-30 ENCOUNTER — Ambulatory Visit: Payer: Medicare Other | Admitting: Family Medicine

## 2018-12-09 ENCOUNTER — Encounter: Payer: Self-pay | Admitting: Family Medicine

## 2018-12-09 ENCOUNTER — Telehealth (INDEPENDENT_AMBULATORY_CARE_PROVIDER_SITE_OTHER): Payer: Medicare Other | Admitting: Family Medicine

## 2018-12-09 ENCOUNTER — Other Ambulatory Visit: Payer: Self-pay

## 2018-12-09 DIAGNOSIS — N390 Urinary tract infection, site not specified: Secondary | ICD-10-CM | POA: Diagnosis not present

## 2018-12-09 MED ORDER — CIPROFLOXACIN HCL 500 MG PO TABS: 500.0000 mg | ORAL_TABLET | Freq: Two times a day (BID) | ORAL | 0 refills | Status: DC

## 2018-12-09 NOTE — Progress Notes (Signed)
Virtual Visit via Telephone Note  I connected with the patient on 12/09/18 at  1:15 PM EDT by telephone and verified that I am speaking with the correct person using two identifiers. We attempted to connect virtually but we had technical difficulties with the audio and video.     I discussed the limitations, risks, security and privacy concerns of performing an evaluation and management service by telephone and the availability of in person appointments. I also discussed with the patient that there may be a patient responsible charge related to this service. The patient expressed understanding and agreed to proceed.  Location patient: home Location provider: work or home office Participants present for the call: patient, provider Patient did not have a visit in the prior 7 days to address this/these issue(s).   History of Present Illness: Here for 3 days of urgency it urinate and burning. No fever or back pain or nausea or blood in the urine. She has been under stress this week since her husband recently passed away.    Observations/Objective: Patient sounds cheerful and well on the phone. I do not appreciate any SOB. Speech and thought processing are grossly intact. Patient reported vitals:  Assessment and Plan: UTI, treat with Cipro. I asked her to drink plenty of water.  Alysia Penna, MD   Follow Up Instructions:     [phone] 5-10 [phone] 11-20 [id number] 21-30 I did not refer this patient for an OV in the next 24 hours for this/these issue(s).  I discussed the assessment and treatment plan with the patient. The patient was provided an opportunity to ask questions and all were answered. The patient agreed with the plan and demonstrated an understanding of the instructions.   The patient was advised to call back or seek an in-person evaluation if the symptoms worsen or if the condition fails to improve as anticipated.  I provided 11 minutes of non-face-to-face time during this  encounter.   Alysia Penna, MD

## 2018-12-26 ENCOUNTER — Other Ambulatory Visit: Payer: Self-pay | Admitting: Family Medicine

## 2019-06-24 ENCOUNTER — Other Ambulatory Visit: Payer: Self-pay | Admitting: Family Medicine

## 2019-07-04 ENCOUNTER — Other Ambulatory Visit: Payer: Self-pay | Admitting: Family Medicine

## 2019-09-07 ENCOUNTER — Telehealth: Payer: Self-pay | Admitting: Family Medicine

## 2019-09-07 NOTE — Telephone Encounter (Signed)
Pt is calling in stating she is out and need a refill on omeprazole (PRILOSEC) 20 MG  Pharm:  [facility]

## 2019-09-07 NOTE — Telephone Encounter (Signed)
Patient notified of update  and verbalized understanding. 

## 2019-09-17 DIAGNOSIS — Z23 Encounter for immunization: Secondary | ICD-10-CM | POA: Diagnosis not present

## 2020-05-03 ENCOUNTER — Other Ambulatory Visit: Payer: Self-pay | Admitting: Family Medicine

## 2020-05-03 MED ORDER — AMLODIPINE BESYLATE 5 MG PO TABS: 5.0000 mg | ORAL_TABLET | Freq: Every day | ORAL | 1 refills | Status: DC

## 2020-06-12 ENCOUNTER — Ambulatory Visit: Payer: Medicare Other

## 2020-06-20 ENCOUNTER — Encounter: Payer: Self-pay | Admitting: Family Medicine

## 2020-06-20 ENCOUNTER — Other Ambulatory Visit: Payer: Self-pay

## 2020-06-20 ENCOUNTER — Ambulatory Visit (INDEPENDENT_AMBULATORY_CARE_PROVIDER_SITE_OTHER): Payer: Medicare Other | Admitting: Family Medicine

## 2020-06-20 VITALS — BP 160/70 | HR 84 | Temp 98.1°F | Resp 16 | Ht 62.0 in | Wt 135.0 lb

## 2020-06-20 DIAGNOSIS — I10 Essential (primary) hypertension: Secondary | ICD-10-CM

## 2020-06-20 DIAGNOSIS — Z1159 Encounter for screening for other viral diseases: Secondary | ICD-10-CM | POA: Diagnosis not present

## 2020-06-20 DIAGNOSIS — K219 Gastro-esophageal reflux disease without esophagitis: Secondary | ICD-10-CM

## 2020-06-20 DIAGNOSIS — E559 Vitamin D deficiency, unspecified: Secondary | ICD-10-CM | POA: Diagnosis not present

## 2020-06-20 DIAGNOSIS — Z0000 Encounter for general adult medical examination without abnormal findings: Secondary | ICD-10-CM | POA: Diagnosis not present

## 2020-06-20 DIAGNOSIS — Z780 Asymptomatic menopausal state: Secondary | ICD-10-CM | POA: Diagnosis not present

## 2020-06-20 DIAGNOSIS — E785 Hyperlipidemia, unspecified: Secondary | ICD-10-CM

## 2020-06-20 LAB — COMPREHENSIVE METABOLIC PANEL
ALT: 18 U/L (ref 0–35)
AST: 17 U/L (ref 0–37)
Albumin: 4.5 g/dL (ref 3.5–5.2)
Alkaline Phosphatase: 122 U/L — ABNORMAL HIGH (ref 39–117)
BUN: 14 mg/dL (ref 6–23)
CO2: 28 mEq/L (ref 19–32)
Calcium: 10 mg/dL (ref 8.4–10.5)
Chloride: 102 mEq/L (ref 96–112)
Creatinine, Ser: 0.79 mg/dL (ref 0.40–1.20)
GFR: 73.35 mL/min (ref >60.00)
Glucose, Bld: 94 mg/dL (ref 70–99)
Potassium: 4.3 mEq/L (ref 3.5–5.1)
Sodium: 139 mEq/L (ref 135–145)
Total Bilirubin: 0.4 mg/dL (ref 0.2–1.2)
Total Protein: 7.4 g/dL (ref 6.0–8.3)

## 2020-06-20 LAB — VITAMIN D 25 HYDROXY (VIT D DEFICIENCY, FRACTURES): VITD: 33.21 ng/mL (ref 30.00–100.00)

## 2020-06-20 MED ORDER — ATORVASTATIN CALCIUM 20 MG PO TABS: ORAL_TABLET | ORAL | 3 refills | Status: DC

## 2020-06-20 MED ORDER — OMEPRAZOLE 20 MG PO CPDR: 20.0000 mg | DELAYED_RELEASE_CAPSULE | Freq: Every day | ORAL | 3 refills | Status: DC

## 2020-06-20 MED ORDER — LISINOPRIL 40 MG PO TABS: 40.0000 mg | ORAL_TABLET | Freq: Every day | ORAL | 2 refills | Status: DC

## 2020-06-20 NOTE — Assessment & Plan Note (Signed)
She is not fasting today, so we will plan on checking FLP next visit. Continue atorvastatin 20 mg daily and low-fat diet.

## 2020-06-20 NOTE — Progress Notes (Signed)
HPI:  Michele Conley is a pleasant 75 y.o. female, who is here today for AWV and follow up.   She was last seen on 04/29/18. Since her last visit her husband passed away. He got sick in 22-Oct-2018, hospitalized for a few months and 5 days at home before dying.  She is living alone, her son and daughter-in-law live close by. Independent ADL's and IADL's.  She does not have an exercise routine but she is active with chores around the house, yard work.  Functional Status Survey: Is the patient deaf or have difficulty hearing?: No Does the patient have difficulty seeing, even when wearing glasses/contacts?: No Does the patient have difficulty concentrating, remembering, or making decisions?: No Does the patient have difficulty walking or climbing stairs?: No Does the patient have difficulty dressing or bathing?: No Does the patient have difficulty doing errands alone such as visiting a doctor's office or shopping?: No  Fall Risk  06/20/2020 03/09/2018 10/21/2016 10/20/2015 07/18/2014  Falls in the past year? 0 0 No No No  Comment - Emmi Telephone Survey: data to providers prior to load - - -  Number falls in past yr: 0 - - - -  Injury with Fall? 0 - - - -  Follow up Education provided - - - -   Providers she sees regularly: Eye care provider: Dr Delman Cheadle. She has not seen provider in 1-2 years.  Depression screen PHQ 2/9 06/20/2020  Decreased Interest 0  Down, Depressed, Hopeless 0  PHQ - 2 Score 0  Feeling sad at time and crying when she thinks about her husband. Sleeping better for the past few weeks,7-8 hours.   Mini-Cog - 06/20/20 1118    Normal clock drawing test? yes    How many words correct? 3           Hearing Screening   _0  _1  _2  _3  _4  _5  _6  _7  _8   Right ear:           Left ear:             Visual Acuity Screening   Right eye Left eye Both eyes  Without correction:     With correction: _9   Vitamin D deficiency: She  is on vitamin D supplementation, she does not remember the dose. Last 25 OH vitamin D 45.4 (21.88). DEXA 10/2012 showed osteopenia. Mammogram 02/25/18.  Hyperlipidemia: She is on atorvastatin 20 mg daily.  Lab Results  Component Value Date   CHOL 235 (H) 04/29/2018   HDL 73.60 04/29/2018   LDLCALC 123 (H) 04/29/2018   LDLDIRECT 122.0 10/21/2016   TRIG 190.0 (H) 04/29/2018   CHOLHDL 3 04/29/2018   Hypertension: Currently she is on lisinopril 40 mg daily and amlodipine 5 mg daily. Negative for severe/frequent headache, visual changes, chest pain, dyspnea, palpitation,focal weakness, or edema. Lab Results  Component Value Date   CREATININE 0.89 04/29/2018   BUN 16 04/29/2018   NA 141 04/29/2018   K 4.4 04/29/2018   CL 104 04/29/2018   CO2 27 04/29/2018   She is not checking BP at home.  GERD: She is on Omeprazole 20 mg daily. She has tried to stop mediation but starts with heartburn and nausea again.  Review of Systems  Constitutional: Negative for activity change, appetite change and fever.  HENT: Negative for mouth sores, nosebleeds, sore throat and trouble swallowing.   Respiratory: Negative for cough and wheezing.   Gastrointestinal: Negative for abdominal pain, nausea and  vomiting.       Negative for changes in bowel habits.  Genitourinary: Negative for decreased urine volume and hematuria.  Neurological: Negative for syncope, facial asymmetry and weakness.  Psychiatric/Behavioral: Negative for confusion and hallucinations.  Rest of ROS, see pertinent positives sand negatives in HPI  Current Outpatient Medications on File Prior to Visit  Medication Sig Dispense Refill  . amLODipine (NORVASC) 5 MG tablet Take 1 tablet (5 mg total) by mouth daily. 90 tablet 1  . aspirin 81 MG chewable tablet Chew 81 mg by mouth daily. EVERY OTHER DAY    . Calcium Carbonate-Vitamin D (CALTRATE 600+D) 600-400 MG-UNIT per tablet Take 1 tablet by mouth daily.      . Coenzyme Q10 (CO Q 10 PO)  Take 50 mg by mouth daily.    . Cyanocobalamin (VITAMIN B 12 PO) Take 500 mcg by mouth daily.    . hydrocortisone (PROCTOSOL HC) 2.5 % rectal cream Place 1 application rectally 2 (two) times daily. (Patient taking differently: Place 1 application rectally as needed. ) 30 g 0  . KRILL OIL OMEGA-3 PO Take 1 capsule by mouth daily.     No current facility-administered medications on file prior to visit.   Past Medical History:  Diagnosis Date  . Arthritis    hands  . Breast cancer [facility]) 1996   Right Breast Cancer  . BREAST CANCER, HX OF 10/31/2006  . Cancer [facility]) 1996   right breast cancer   . 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 Diverticulosis   . GERD 10/31/2006  . Gout   . Hyperlipidemia   . HYPERTENSION 10/31/2006  . LBBB (left bundle branch block)   . OSTEOPOROSIS 10/31/2006   osteopenia per pt not porosis   . Personal history of radiation therapy 1996   Right Breast Cancer   Allergies  Allergen Reactions  . Simvastatin     REACTION: nausea  . Shrimp [Shellfish Allergy] Rash    Social History   Socioeconomic History  . Marital status: Married    Spouse name: Not on file  . Number of children: Not on file  . Years of education: Not on file  . Highest education level: Not on file  Occupational History  . Not on file  Tobacco Use  . Smoking status: Never Smoker  . Smokeless tobacco: Never Used  Substance and Sexual Activity  . Alcohol use: Yes    Comment: very rare  . Drug use: No  . Sexual activity: Not on file  Other Topics Concern  . Not on file  Social History Narrative   Married 1969. 2 sons Nathaneil Canary in [place] in [place]. 5 grandkids, 1 greatgranddaughter, 1 great step grandson.       Retired from 19 years with [employer]      Hobbies: travel/sightseeing, time at [employer], [place] with friends. [employer] in [place] Determinants of Health   Financial Resource Strain: Not on file  Food Insecurity: Not on file   Transportation Needs: Not on file  Physical Activity: Not on file  Stress: Not on file  Social Connections: Not on file   Vitals:   06/20/20 1056  BP: (!) 160/70  Pulse: 84  Resp: 16  Temp: 98.1 F (36.7 C)  SpO2: 96%   Body mass index is 24.69 kg/m.  Physical Exam Vitals and nursing note reviewed.  Constitutional:      General: She is not in acute distress.  Appearance: She is well-developed.  HENT:     Head: Normocephalic and atraumatic.     Mouth/Throat:     Mouth: Oropharynx is clear and moist and mucous membranes are normal.  Eyes:     Conjunctiva/sclera: Conjunctivae normal.  Cardiovascular:     Rate and Rhythm: Normal rate and regular rhythm.     Pulses:          Dorsalis pedis pulses are 2+ on the right side and 2+ on the left side.     Heart sounds: No murmur heard.   Pulmonary:     Effort: Pulmonary effort is normal. No respiratory distress.     Breath sounds: Normal breath sounds.  Abdominal:     Palpations: Abdomen is soft. There is no hepatomegaly or mass.     Tenderness: There is no abdominal tenderness.  Musculoskeletal:        General: No edema.  Lymphadenopathy:     Cervical: No cervical adenopathy.  Skin:    General: Skin is warm.     Findings: No erythema or rash.  Neurological:     General: No focal deficit present.     Mental Status: She is alert and oriented to person, place, and time.     Cranial Nerves: No cranial nerve deficit.     Gait: Gait normal.     Deep Tendon Reflexes: Strength normal.  Psychiatric:        Mood and Affect: Affect is labile.     Comments: Well groomed, good eye contact.   ASSESSMENT AND PLAN:  Michele Conley was seen today for AWV and follow-up.  Orders Placed This Encounter  Procedures  . DG Bone Density  . Comprehensive metabolic panel  . VITAMIN D 25 Hydroxy (Vit-D Deficiency, Fractures)  . Hepatitis C antibody   Lab Results  Component Value Date   CREATININE 0.79 06/20/2020   BUN 14  06/20/2020   NA 139 06/20/2020   K 4.3 06/20/2020   CL 102 06/20/2020   CO2 28 06/20/2020   Lab Results  Component Value Date   ALT 18 06/20/2020   AST 17 06/20/2020   ALKPHOS 122 (H) 06/20/2020   BILITOT 0.4 06/20/2020   Medicare annual wellness visit, subsequent We discussed the importance of staying active, physically and mentally, as well as the benefits of a healthy/balance diet. Low impact exercise that involve stretching and strengthing are ideal. Vaccines up to date. We discussed preventive screening for the next 5-10 years, summery of recommendations given in AVS. Fall prevention. Advance directives and end of life discussed, she does not have POA or living will, package given.   Encounter for HCV screening test for low risk patient -     Hepatitis C antibody  Asymptomatic postmenopausal estrogen deficiency -     DG Bone Density; Future  Vitamin D deficiency Continue current dose of vitamin D supplementation. Further recommendation will be given according to 25 OH vitamin D result.  GERD Problem is well controlled. Continue omeprazole 20 mg daily, she can try taking medication every other day. Continue GERD precautions.  Essential hypertension BP rechecked: 170/80. Before changing medications, recommend monitoring BP at home and to let me know about BP readings in 2 to 3 weeks. For now continue lisinopril 40 mg and amlodipine 5 mg daily. Low-salt diet. We discussed possible complications of elevated BP.  Hyperlipidemia She is not fasting today, so we will plan on checking FLP next visit. Continue atorvastatin 20 mg daily and  low-fat diet.   Return in about 4 months (around 10/20/2020) for HTN and HLD.  Oswald Pott G. Martin[place], MD  [facility]. [place] office.   Ms. Tiley , Thank you for taking time to come for your Medicare Wellness Visit. I appreciate your ongoing commitment to your health goals. Please review the following plan we discussed and  let me know if I can assist you in the future.   These are the goals we discussed: Goals    . Exercise 3x per week (30 min per time)     10-15 min of daily walking. Walking in place at home during cold weather and /or Tai chi. Continue fall prevention.       This is a list of the screening recommended for you and due dates:  Health Maintenance  Topic Date Due  . COVID-19 Vaccine (1) Never done  . Flu Shot  11/21/2019  . Tetanus Vaccine  04/22/2020  .  Hepatitis C: One time screening is recommended by Center for Disease Control  (CDC) for  adults born from [birth year] through 1965.   11/20/2048*  . Colon Cancer Screening  01/09/2022  . DEXA scan (bone density measurement)  Completed  . Pneumonia vaccines  Completed  . HPV Vaccine  Aged Out  *Topic was postponed. The date shown is not the original due date.   A few things to remember from today's visit:   Encounter for HCV screening test for low risk patient - Plan: Hepatitis C antibody  Hyperlipidemia, unspecified hyperlipidemia type - Plan: Comprehensive metabolic panel, atorvastatin (LIPITOR) 20 MG tablet  Vitamin D deficiency - Plan: VITAMIN D 25 Hydroxy (Vit-D Deficiency, Fractures)  Essential hypertension  Medicare annual wellness visit, subsequent  Asymptomatic postmenopausal estrogen deficiency - Plan: DG Bone Density  If you need refills please call your pharmacy. Do not use My Chart to request refills or for acute issues that need immediate attention.   Monitor blood pressure at home and let me know about readings in 2-3 weeks.  Please be sure medication list is accurate. If a new problem present, please set up appointment sooner than planned today.  A few tips:  -As we age balance is not as good as it was, so there is a higher risks for falls. Please remove small rugs and furniture that is "in your way" and could increase the risk of falls. Stretching exercises may help with fall prevention: Yoga and Tai Chi are  some examples. Low impact exercise is better, so you are not very achy the next day.  -Sun screen and avoidance of direct sun light recommended. Caution with dehydration, if working outdoors be sure to drink enough fluids.  - Some medications are not safe as we age, increases the risk of side effects and can potentially interact with other medication you are also taken;  including some of over the counter medications. Be sure to let me know when you start a new medication even if it is a dietary/vitamin supplement.   -Healthy diet low in red meet/animal fat and sugar + regular physical activity is recommended.

## 2020-06-20 NOTE — Assessment & Plan Note (Signed)
Problem is well controlled. Continue omeprazole 20 mg daily, she can try taking medication every other day. Continue GERD precautions.

## 2020-06-20 NOTE — Assessment & Plan Note (Signed)
BP rechecked: 170/80. Before changing medications, recommend monitoring BP at home and to let me know about BP readings in 2 to 3 weeks. For now continue lisinopril 40 mg and amlodipine 5 mg daily. Low-salt diet. We discussed possible complications of elevated BP.

## 2020-06-20 NOTE — Patient Instructions (Addendum)
  Ms. Fazzino , Thank you for taking time to come for your Medicare Wellness Visit. I appreciate your ongoing commitment to your health goals. Please review the following plan we discussed and let me know if I can assist you in the future.   These are the goals we discussed: Goals    . Exercise 3x per week (30 min per time)     10-15 min of daily walking. Walking in place at home during cold weather and /or Tai chi. Continue fall prevention.       This is a list of the screening recommended for you and due dates:  Health Maintenance  Topic Date Due  . COVID-19 Vaccine (1) Never done  . Flu Shot  11/21/2019  . Tetanus Vaccine  04/22/2020  .  Hepatitis C: One time screening is recommended by Center for Disease Control  (CDC) for  adults born from [birth year] through 1965.   11/20/2048*  . Colon Cancer Screening  01/09/2022  . DEXA scan (bone density measurement)  Completed  . Pneumonia vaccines  Completed  . HPV Vaccine  Aged Out  *Topic was postponed. The date shown is not the original due date.   A few things to remember from today's visit:   Encounter for HCV screening test for low risk patient - Plan: Hepatitis C antibody  Hyperlipidemia, unspecified hyperlipidemia type - Plan: Comprehensive metabolic panel, atorvastatin (LIPITOR) 20 MG tablet  Vitamin D deficiency - Plan: VITAMIN D 25 Hydroxy (Vit-D Deficiency, Fractures)  Essential hypertension  Medicare annual wellness visit, subsequent  Asymptomatic postmenopausal estrogen deficiency - Plan: DG Bone Density  If you need refills please call your pharmacy. Do not use My Chart to request refills or for acute issues that need immediate attention.   Monitor blood pressure at home and let me know about readings in 2-3 weeks.  Please be sure medication list is accurate. If a new problem present, please set up appointment sooner than planned today.  A few tips:  -As we age balance is not as good as it was, so there is a higher  risks for falls. Please remove small rugs and furniture that is "in your way" and could increase the risk of falls. Stretching exercises may help with fall prevention: Yoga and Tai Chi are some examples. Low impact exercise is better, so you are not very achy the next day.  -Sun screen and avoidance of direct sun light recommended. Caution with dehydration, if working outdoors be sure to drink enough fluids.  - Some medications are not safe as we age, increases the risk of side effects and can potentially interact with other medication you are also taken;  including some of over the counter medications. Be sure to let me know when you start a new medication even if it is a dietary/vitamin supplement.   -Healthy diet low in red meet/animal fat and sugar + regular physical activity is recommended.

## 2020-06-20 NOTE — Assessment & Plan Note (Signed)
Continue current dose of vitamin D supplementation. Further recommendation will be given according to 25 OH vitamin D result. 

## 2020-06-21 LAB — HEPATITIS C ANTIBODY
Hepatitis C Ab: NONREACTIVE
SIGNAL TO CUT-OFF: 0 (ref <1.00)

## 2020-06-30 ENCOUNTER — Other Ambulatory Visit: Payer: Self-pay | Admitting: Family Medicine

## 2020-06-30 DIAGNOSIS — Z1231 Encounter for screening mammogram for malignant neoplasm of breast: Secondary | ICD-10-CM

## 2020-10-19 ENCOUNTER — Other Ambulatory Visit: Payer: Self-pay

## 2020-10-19 ENCOUNTER — Ambulatory Visit (INDEPENDENT_AMBULATORY_CARE_PROVIDER_SITE_OTHER): Payer: Medicare Other | Admitting: Family Medicine

## 2020-10-19 ENCOUNTER — Encounter: Payer: Self-pay | Admitting: Family Medicine

## 2020-10-19 VITALS — BP 130/80 | HR 75 | Resp 16 | Ht 62.0 in | Wt 135.1 lb

## 2020-10-19 DIAGNOSIS — K649 Unspecified hemorrhoids: Secondary | ICD-10-CM | POA: Diagnosis not present

## 2020-10-19 DIAGNOSIS — I10 Essential (primary) hypertension: Secondary | ICD-10-CM

## 2020-10-19 DIAGNOSIS — E785 Hyperlipidemia, unspecified: Secondary | ICD-10-CM | POA: Diagnosis not present

## 2020-10-19 DIAGNOSIS — N1831 Chronic kidney disease, stage 3a: Secondary | ICD-10-CM

## 2020-10-19 DIAGNOSIS — K219 Gastro-esophageal reflux disease without esophagitis: Secondary | ICD-10-CM | POA: Diagnosis not present

## 2020-10-19 LAB — LIPID PANEL
Cholesterol: 204 mg/dL — ABNORMAL HIGH (ref 0–200)
HDL: 72.2 mg/dL (ref >39.00)
LDL Cholesterol: 98 mg/dL (ref 0–99)
NonHDL: 131.89
Total CHOL/HDL Ratio: 3
Triglycerides: 171 mg/dL — ABNORMAL HIGH (ref 0.0–149.0)
VLDL: 34.2 mg/dL (ref 0.0–40.0)

## 2020-10-19 LAB — MICROALBUMIN / CREATININE URINE RATIO
Creatinine,U: 131.9 mg/dL
Microalb Creat Ratio: 4.5 mg/g (ref 0.0–30.0)
Microalb, Ur: 5.9 mg/dL — ABNORMAL HIGH (ref 0.0–1.9)

## 2020-10-19 LAB — TSH: TSH: 1.42 u[IU]/mL (ref 0.35–5.50)

## 2020-10-19 MED ORDER — OMEPRAZOLE 20 MG PO CPDR: 20.0000 mg | DELAYED_RELEASE_CAPSULE | Freq: Every day | ORAL | 2 refills | Status: DC

## 2020-10-19 MED ORDER — AMLODIPINE BESYLATE 10 MG PO TABS: 10.0000 mg | ORAL_TABLET | Freq: Every day | ORAL | 1 refills | Status: DC

## 2020-10-19 MED ORDER — HYDROCORTISONE (PERIANAL) 2.5 % EX CREA: 1.0000 "application " | TOPICAL_CREAM | Freq: Every day | CUTANEOUS | 1 refills | Status: AC | PRN

## 2020-10-19 NOTE — Progress Notes (Signed)
HPI: Michele Conley is a 75 y.o. female, who is here today for follow up.   She was last seen on 06/20/20.  For years she has had problems with external hemorrhoids. She is requesting a Rx for anusol cream, which has helped in the past with pruritus and discomfort. Negative for dyschezia and hematochezia. Colonoscopy in 12/2016.  HTN: She is on Amlodipine 5 mg daily and Lisinopril 40 mg daily. BP have been elevated at home, 140-170's/60-70's. Negative for severe/frequent headache, visual changes, chest pain, dyspnea, palpitation, focal weakness, or edema.  CKD III:She has not noted gross hematuria, foam in urine,or decreased urine outout.  Lab Results  Component Value Date   CREATININE 0.79 06/20/2020   BUN 14 06/20/2020   NA 139 06/20/2020   K 4.3 06/20/2020   CL 102 06/20/2020   CO2 28 06/20/2020   HLD: She is on Atorvastatin 20 mg daily. Tolerating medication well.  Lab Results  Component Value Date   CHOL 235 (H) 04/29/2018   HDL 73.60 04/29/2018   LDLCALC 123 (H) 04/29/2018   LDLDIRECT 122.0 10/21/2016   TRIG 190.0 (H) 04/29/2018   CHOLHDL 3 04/29/2018   GERD: She is on Omeprazole 20 mg daily. Medication helps with heartburn, which she gets if she misses medication for a few days.  Review of Systems  Constitutional:  Negative for activity change, appetite change and fever.  HENT:  Negative for mouth sores, nosebleeds, sore throat and trouble swallowing.   Respiratory:  Negative for cough and wheezing.   Gastrointestinal:  Negative for abdominal pain, nausea and vomiting.       Negative for changes in bowel habits.  Neurological:  Negative for syncope, facial asymmetry and weakness.  Rest of ROS, see pertinent positives sand negatives in HPI  Current Outpatient Medications on File Prior to Visit  Medication Sig Dispense Refill   aspirin 81 MG chewable tablet Chew 81 mg by mouth daily. EVERY OTHER DAY     atorvastatin (LIPITOR) 20 MG tablet TAKE 1  TABLET DAILY (DISCONTINUE 40 MG) 90 tablet 3   Calcium Carbonate-Vitamin D 600-400 MG-UNIT tablet Take 1 tablet by mouth daily.       Coenzyme Q10 (CO Q 10 PO) Take 50 mg by mouth daily.     Cyanocobalamin (VITAMIN B 12 PO) Take 500 mcg by mouth daily.     hydrocortisone (PROCTOSOL HC) 2.5 % rectal cream Place 1 application rectally 2 (two) times daily. (Patient taking differently: Place 1 application rectally as needed.) 30 g 0   KRILL OIL OMEGA-3 PO Take 1 capsule by mouth daily.     lisinopril (ZESTRIL) 40 MG tablet Take 1 tablet (40 mg total) by mouth daily. 90 tablet 2   No current facility-administered medications on file prior to visit.   Past Medical History:  Diagnosis Date   Arthritis    hands   Breast cancer [facility]) 1996   Right Breast Cancer   BREAST CANCER, HX OF 10/31/2006   Cancer [facility]) 1996   right breast cancer    Chronic kidney disease    stage II pt thinks    Diverticulosis    GERD 10/31/2006   Gout    Hyperlipidemia    HYPERTENSION 10/31/2006   LBBB (left bundle branch block)    OSTEOPOROSIS 10/31/2006   osteopenia per pt not porosis    Personal history of radiation therapy 1996   Right Breast Cancer   Allergies  Allergen Reactions   Simvastatin  REACTION: nausea   Shrimp [Shellfish Allergy] Rash    Social History   Socioeconomic History   Marital status: Married    Spouse name: Not on file   Number of children: Not on file   Years of education: Not on file   Highest education level: Not on file  Occupational History   Not on file  Tobacco Use   Smoking status: Never   Smokeless tobacco: Never  Substance and Sexual Activity   Alcohol use: Yes    Comment: very rare   Drug use: No   Sexual activity: Not on file  Other Topics Concern   Not on file  Social History Narrative   Married 1969. 2 sons Nathaneil Canary in [place] in [place]. 5 grandkids, 1 greatgranddaughter, 1 great step grandson.       Retired from 65 years with [employer]       Hobbies: travel/sightseeing, time at [employer], [place] with friends. [employer] in [place] Determinants of Health   Financial Resource Strain: Not on file  Food Insecurity: Not on file  Transportation Needs: Not on file  Physical Activity: Not on file  Stress: Not on file  Social Connections: Not on file   Vitals:   10/19/20 0907  BP: 130/80  Pulse: 75  Resp: 16  SpO2: 97%   Body mass index is 24.71 kg/m.  Physical Exam Vitals and nursing note reviewed.  Constitutional:      General: She is not in acute distress.    Appearance: She is well-developed.  HENT:     Head: Normocephalic and atraumatic.     Mouth/Throat:     Mouth: Mucous membranes are moist.     Pharynx: Oropharynx is clear.  Eyes:     Conjunctiva/sclera: Conjunctivae normal.  Cardiovascular:     Rate and Rhythm: Normal rate and regular rhythm.     Pulses:          Dorsalis pedis pulses are 2+ on the right side and 2+ on the left side.     Heart sounds: No murmur heard. Pulmonary:     Effort: Pulmonary effort is normal. No respiratory distress.     Breath sounds: Normal breath sounds.  Abdominal:     Palpations: Abdomen is soft. There is no hepatomegaly or mass.     Tenderness: There is no abdominal tenderness.  Genitourinary:    Rectum: External hemorrhoid present. No mass or anal fissure.     Comments: Skin tags. Lymphadenopathy:     Cervical: No cervical adenopathy.  Skin:    General: Skin is warm.     Findings: No erythema or rash.  Neurological:     General: No focal deficit present.     Mental Status: She is alert and oriented to person, place, and time.     Cranial Nerves: No cranial nerve deficit.     Gait: Gait normal.  Psychiatric:        Mood and Affect: Mood is anxious.     Comments: Well groomed, good eye contact.   ASSESSMENT AND PLAN: Michele Conley was seen today for follow-up.  Orders Placed This Encounter  Procedures   Lipid panel   TSH    Microalbumin / creatinine urine ratio   Lab Results  Component Value Date   MICROALBUR 5.9 (H) 10/19/2020   MICROALBUR 1.5 05/30/2017   Lab Results  Component Value Date   CHOL 204 (H) 10/19/2020   HDL 72.20 10/19/2020  LDLCALC 98 10/19/2020   LDLDIRECT 122.0 10/21/2016   TRIG 171.0 (H) 10/19/2020   CHOLHDL 3 10/19/2020   Lab Results  Component Value Date   TSH 1.42 10/19/2020   Hyperlipidemia, unspecified hyperlipidemia type Continue Atorvastatin 20 mg daily and low fat diet. Further recommendations according to lipid panel result.  Essential hypertension Home BP readings elevated. BP re-checked 190/80. Increase Amlodipine from 5 mg to 10 mg daily. No changes in Lisinopril dose. We discussed possible complications of elevated BP. Low salt diet.  -     amLODipine (NORVASC) 10 MG tablet; Take 1 tablet (10 mg total) by mouth daily.  Hemorrhoids, unspecified hemorrhoid type Chronic. We discussed some side effects of chronic topical steroid, so recommend a small dose at the time daily prn. Avoid constipation,prolonged toilet time,and straining.  -     hydrocortisone (PROCTOSOL HC) 2.5 % rectal cream; Place 1 application rectally daily as needed for hemorrhoids or anal itching.  Gastroesophageal reflux disease without esophagitis Problem is well controlled. She could try taking medication every other day. Continue GERD precautions.  -     omeprazole (PRILOSEC) 20 MG capsule; Take 1 capsule (20 mg total) by mouth daily.  Stage 3a chronic kidney disease ([place]) Problem has been stable, e GFR > 60 since 04/2018. 10/2016 e GFR 59 and in 10/2017 52. Adequate BP controlled. Low salt diet. Continue avoidance of NSAID's.  Return in about 2 months (around 12/19/2020).   Sonam Huelsmann G. Martin[place], MD  [facility]. [place] office.

## 2020-10-19 NOTE — Patient Instructions (Addendum)
A few things to remember from today's visit:   Hyperlipidemia, unspecified hyperlipidemia type - Plan: Lipid panel  Essential hypertension - Plan: TSH  Hemorrhoids, unspecified hemorrhoid type  Gastroesophageal reflux disease without esophagitis - Plan: omeprazole (PRILOSEC) 20 MG capsule  Stage 3a chronic kidney disease ([place]) - Plan: Microalbumin / creatinine urine ratio  If you need refills please call your pharmacy. Do not use My Chart to request refills or for acute issues that need immediate attention.   Amlodipine increased from 5 mg to 10 mg.Take it at bedtime. Rest unchanged. Continue monitoring blood pressure and bring monitor next visit. Let me know about blood pressure readings in 3-4 weeks.  Proctosol cream a very small amount. Use wipes instead toilet paper.  Please be sure medication list is accurate. If a new problem present, please set up appointment sooner than planned today.

## 2020-10-20 ENCOUNTER — Ambulatory Visit: Payer: Medicare Other | Admitting: Family Medicine

## 2020-10-22 ENCOUNTER — Encounter: Payer: Self-pay | Admitting: Family Medicine

## 2020-11-10 ENCOUNTER — Encounter: Payer: Self-pay | Admitting: Family Medicine

## 2020-12-08 ENCOUNTER — Ambulatory Visit
Admission: RE | Admit: 2020-12-08 | Discharge: 2020-12-08 | Disposition: A | Discharge status: Home / Self Care | Payer: Medicare Other | Source: Ambulatory Visit | Attending: Family Medicine | Admitting: Family Medicine

## 2020-12-08 ENCOUNTER — Other Ambulatory Visit: Payer: Self-pay

## 2020-12-08 DIAGNOSIS — Z1231 Encounter for screening mammogram for malignant neoplasm of breast: Secondary | ICD-10-CM

## 2020-12-08 DIAGNOSIS — M8589 Other specified disorders of bone density and structure, multiple sites: Secondary | ICD-10-CM | POA: Diagnosis not present

## 2020-12-08 DIAGNOSIS — Z780 Asymptomatic menopausal state: Secondary | ICD-10-CM

## 2020-12-19 ENCOUNTER — Ambulatory Visit: Payer: Medicare Other | Admitting: Family Medicine

## 2020-12-28 ENCOUNTER — Other Ambulatory Visit: Payer: Self-pay

## 2020-12-29 ENCOUNTER — Encounter: Payer: Self-pay | Admitting: Family Medicine

## 2020-12-29 ENCOUNTER — Ambulatory Visit (INDEPENDENT_AMBULATORY_CARE_PROVIDER_SITE_OTHER): Payer: Medicare Other | Admitting: Family Medicine

## 2020-12-29 VITALS — BP 138/80 | HR 60 | Temp 98.0°F | Resp 16 | Ht 62.0 in | Wt 135.1 lb

## 2020-12-29 DIAGNOSIS — I10 Essential (primary) hypertension: Secondary | ICD-10-CM

## 2020-12-29 DIAGNOSIS — Z23 Encounter for immunization: Secondary | ICD-10-CM | POA: Diagnosis not present

## 2020-12-29 DIAGNOSIS — I447 Left bundle-branch block, unspecified: Secondary | ICD-10-CM | POA: Diagnosis not present

## 2020-12-29 DIAGNOSIS — N1831 Chronic kidney disease, stage 3a: Secondary | ICD-10-CM

## 2020-12-29 LAB — BASIC METABOLIC PANEL
BUN: 19 mg/dL (ref 6–23)
CO2: 27 mEq/L (ref 19–32)
Calcium: 9.6 mg/dL (ref 8.4–10.5)
Chloride: 105 mEq/L (ref 96–112)
Creatinine, Ser: 0.86 mg/dL (ref 0.40–1.20)
GFR: 66 mL/min (ref >60.00)
Glucose, Bld: 91 mg/dL (ref 70–99)
Potassium: 4.3 mEq/L (ref 3.5–5.1)
Sodium: 141 mEq/L (ref 135–145)

## 2020-12-29 NOTE — Patient Instructions (Signed)
A few things to remember from today's visit:   Essential hypertension - Plan: EKG 12-Lead  Need for influenza vaccination - Plan: Flu Vaccine QUAD High Dose(Fluad)  LBBB (left bundle branch block) - Plan: EKG 12-Lead  If you need refills please call your pharmacy. No changes today.  Do not use My Chart to request refills or for acute issues that need immediate attention.   How to Take Your Blood Pressure Blood pressure measures how strongly your blood is pressing against the walls of your arteries. Arteries are blood vessels that carry blood from your heart throughout your body. You can take your blood pressure at home with a machine. You may need to check your blood pressure at home: To check if you have high blood pressure (hypertension). To check your blood pressure over time. To make sure your blood pressure medicine is working. Supplies needed: Blood pressure machine, or monitor. Dining room chair to sit in. Table or desk. Small notebook. Pencil or pen. How to prepare Avoid these things for 30 minutes before checking your blood pressure: Having drinks with caffeine in them, such as coffee or tea. Drinking alcohol. Eating. Smoking. Exercising. Do these things five minutes before checking your blood pressure: Go to the bathroom and pee (urinate). Sit in a dining chair. Do not sit on a soft couch or an armchair. Be quiet. Do not talk. How to take your blood pressure Follow the instructions that came with your machine. If you have a digital blood pressure monitor, these may be the instructions: Sit up straight. Place your feet on the floor. Do not cross your ankles or legs. Rest your left arm at the level of your heart. You may rest it on a table, desk, or chair. Pull up your shirt sleeve. Wrap the blood pressure cuff around the upper part of your left arm. The cuff should be 1 inch (2.5 cm) above your elbow. It is best to wrap the cuff around bare skin. Fit the cuff  snugly around your arm. You should be able to place only one finger between the cuff and your arm. Place the cord so that it rests in the bend of your elbow. Press the power button. Sit quietly while the cuff fills with air and loses air. Write down the numbers on the screen. Wait 2-3 minutes and then repeat steps 1-10. What do the numbers mean? Two numbers make up your blood pressure. The first number is called systolic pressure. The second is called diastolic pressure. An example of a blood pressure reading is "120 over 80" (or 120/80). If you are an adult and do not have a medical condition, use this guide to find out if your blood pressure is normal: Normal First number: below 120. Second number: below 80. Elevated First number: 120-129. Second number: below 80. Hypertension stage 1 First number: 130-139. Second number: 80-89. Hypertension stage 2 First number: 140 or above. Second number: 69 or above. Your blood pressure is above normal even if only the first or only the second number is above normal. Follow these instructions at home: Medicines Take over-the-counter and prescription medicines only as told by your doctor. Tell your doctor if your medicine is causing side effects. General instructions Check your blood pressure as often as your doctor tells you to. Check your blood pressure at the same time every day. Take your monitor to your next doctor's appointment. Your doctor will: Make sure you are using it correctly. Make sure it is working right.  Understand what your blood pressure numbers should be. Keep all follow-up visits as told by your doctor. This is important. General tips You will need a blood pressure machine, or monitor. Your doctor can suggest a monitor. You can buy one at a drugstore or online. When choosing one: Choose one with an arm cuff. Choose one that wraps around your upper arm. Only one finger should fit between your arm and the cuff. Do not  choose one that measures your blood pressure from your wrist or finger. Where to find more information American Heart Association: www.heart.org Contact a doctor if: Your blood pressure keeps being high. Your blood pressure is suddenly low. Get help right away if: Your first blood pressure number is higher than 180. Your second blood pressure number is higher than 120. Summary Check your blood pressure at the same time every day. Avoid caffeine, alcohol, smoking, and exercise for 30 minutes before checking your blood pressure. Make sure you understand what your blood pressure numbers should be. This information is not intended to replace advice given to you by your health care provider. Make sure you discuss any questions you have with your health care provider. Document Revised: 02/16/2020 Document Reviewed: 04/02/2019 Elsevier Patient Education  2022 [employer].  Please be sure medication list is accurate. If a new problem present, please set up appointment sooner than planned today.

## 2020-12-29 NOTE — Assessment & Plan Note (Signed)
BP with her BP monitor 172/75, I re-checked 160/75-160/80. Her BP cuff is large, she needs a smaller one. Appropriate BP technique discussed. For now no changes in Lisinopril or Amlodipine dose.  DASH/low salt diet to continue. Continue monitoring BP at home.

## 2020-12-29 NOTE — Assessment & Plan Note (Addendum)
Discussed Dx,electrophysiology, and possible complications. Adenosine stress test in 10/2006 negative. Asymptomatic. EKG today: No changes when compared with labs EKG in 10/2012. I do not think further work-up is needed at this time. Instructed about warning sings.

## 2020-12-29 NOTE — Progress Notes (Signed)
HPI: Michele Conley is a 75 y.o. female, who is here today for 2-3 months follow up.   She was last seen on 10/19/20. No new problems since her last visit.  Hypertension: Dx'ed 15+ years ago. Medications:She is on Amlodipine 10 mg daily and Lisinopril 40 mg daily. BP readings at home:140's/60-70's.She brought BP monitor. Side effects:None.  Negative for unusual or severe headache, visual changes, exertional chest pain, dyspnea,  focal weakness, or edema.  Lab Results  Component Value Date   CREATININE 0.79 06/20/2020   BUN 14 06/20/2020   NA 139 06/20/2020   K 4.3 06/20/2020   CL 102 06/20/2020   CO2 28 06/20/2020   Lab Results  Component Value Date   MICROALBUR 5.9 (H) 10/19/2020   MICROALBUR 1.5 05/30/2017   She has some questions about her renal function and LBBB. LBBB for many years. In 10/2006 adenosine stress test was perform because abnormal EKG and otherwise negative. Last EKG in 2014.  Dx'ed with CKD III in 2015. Her renal function has been > 60 for the past 12 months, it was in the mid 50's. She has not noted decreased urine output,foam in urine,or gross hematuria.  GERD: She is on Omeprazole 20 mg daily. Medication helps with heartburn, which she gets if she misses medication for a few days.  Review of Systems  Constitutional:  Negative for activity change, appetite change, fatigue and fever.  HENT:  Negative for mouth sores, nosebleeds and sore throat.   Respiratory:  Negative for cough and wheezing.   Gastrointestinal:  Negative for abdominal pain, nausea and vomiting.       Negative for changes in bowel habits.  Genitourinary:  Negative for difficulty urinating and dysuria.  Musculoskeletal:  Negative for gait problem and myalgias.  Skin:  Negative for pallor and rash.  Neurological:  Negative for syncope, facial asymmetry and weakness.  Psychiatric/Behavioral:  Negative for confusion. The patient is nervous/anxious.   Rest of ROS, see  pertinent positives sand negatives in HPI  Current Outpatient Medications on File Prior to Visit  Medication Sig Dispense Refill   amLODipine (NORVASC) 10 MG tablet Take 1 tablet (10 mg total) by mouth daily. 90 tablet 1   aspirin 81 MG chewable tablet Chew 81 mg by mouth daily. EVERY OTHER DAY     atorvastatin (LIPITOR) 20 MG tablet TAKE 1 TABLET DAILY (DISCONTINUE 40 MG) 90 tablet 3   Calcium Carbonate-Vitamin D 600-400 MG-UNIT tablet Take 1 tablet by mouth daily.       Coenzyme Q10 (CO Q 10 PO) Take 50 mg by mouth daily.     Cyanocobalamin (VITAMIN B 12 PO) Take 500 mcg by mouth daily.     hydrocortisone (PROCTOSOL HC) 2.5 % rectal cream Place 1 application rectally daily as needed for hemorrhoids or anal itching. 28 g 1   KRILL OIL OMEGA-3 PO Take 1 capsule by mouth daily.     lisinopril (ZESTRIL) 40 MG tablet Take 1 tablet (40 mg total) by mouth daily. 90 tablet 2   omeprazole (PRILOSEC) 20 MG capsule Take 1 capsule (20 mg total) by mouth daily. 90 capsule 2   No current facility-administered medications on file prior to visit.   Past Medical History:  Diagnosis Date   Arthritis    hands   Breast cancer [facility]) 1996   Right Breast Cancer   BREAST CANCER, HX OF 10/31/2006   Cancer ([place]) 1996   right breast cancer    Chronic kidney disease  stage II pt thinks    Diverticulosis    GERD 10/31/2006   Gout    Hyperlipidemia    HYPERTENSION 10/31/2006   LBBB (left bundle branch block)    OSTEOPOROSIS 10/31/2006   osteopenia per pt not porosis    Personal history of radiation therapy 1996   Right Breast Cancer   Allergies  Allergen Reactions   Simvastatin     REACTION: nausea   Shrimp [Shellfish Allergy] Rash   Social History   Socioeconomic History   Marital status: Married    Spouse name: Not on file   Number of children: Not on file   Years of education: Not on file   Highest education level: Not on file  Occupational History   Not on file  Tobacco Use   Smoking  status: Never   Smokeless tobacco: Never  Substance and Sexual Activity   Alcohol use: Yes    Comment: very rare   Drug use: No   Sexual activity: Not on file  Other Topics Concern   Not on file  Social History Narrative   Married 1969. 2 sons Nathaneil Canary in [place] and [place] in [place]. 5 grandkids, 1 greatgranddaughter, 1 great step grandson.       Retired from 58 years with [employer]      Hobbies: travel/sightseeing, time at [employer], [place] with friends. [employer] in [place] Determinants of Health   Financial Resource Strain: Not on file  Food Insecurity: Not on file  Transportation Needs: Not on file  Physical Activity: Not on file  Stress: Not on file  Social Connections: Not on file   Vitals:   12/29/20 0906  BP: 138/80  Pulse: 60  Resp: 16  Temp: 98 F (36.7 C)  SpO2: 97%   Body mass index is 24.71 kg/m.  Physical Exam Vitals and nursing note reviewed.  Constitutional:      General: She is not in acute distress.    Appearance: She is well-developed.  HENT:     Head: Normocephalic and atraumatic.     Mouth/Throat:     Mouth: Mucous membranes are moist.     Pharynx: Oropharynx is clear.  Eyes:     Conjunctiva/sclera: Conjunctivae normal.  Cardiovascular:     Rate and Rhythm: Normal rate and regular rhythm.     Pulses:          Dorsalis pedis pulses are 2+ on the right side and 2+ on the left side.     Heart sounds: No murmur heard. Pulmonary:     Effort: Pulmonary effort is normal. No respiratory distress.     Breath sounds: Normal breath sounds.  Abdominal:     Palpations: Abdomen is soft. There is no hepatomegaly or mass.     Tenderness: There is no abdominal tenderness.  Lymphadenopathy:     Cervical: No cervical adenopathy.  Skin:    General: Skin is warm.     Findings: No erythema or rash.  Neurological:     General: No focal deficit present.     Mental Status: She is alert and oriented to person, place, and time.      Cranial Nerves: No cranial nerve deficit.     Gait: Gait normal.  Psychiatric:        Mood and Affect: Mood is anxious.     Comments: Well groomed, good eye contact.   ASSESSMENT AND PLAN:  Michele Conley was seen today for 2-3 months follow-up.  Orders Placed This Encounter  Procedures   Flu Vaccine QUAD High Dose(Fluad)   Basic metabolic panel   EKG [id number]   Lab Results  Component Value Date   CREATININE 0.86 12/29/2020   BUN 19 12/29/2020   NA 141 12/29/2020   K 4.3 12/29/2020   CL 105 12/29/2020   CO2 27 12/29/2020   LBBB (left bundle branch block) Discussed Dx,electrophysiology, and possible complications. Adenosine stress test in 10/2006 negative. Asymptomatic. EKG today: No changes when compared with labs EKG in 10/2012. I do not think further work-up is needed at this time. Instructed about warning sings.  Essential hypertension BP with her BP monitor 172/75, I re-checked 160/75-160/80. Her BP cuff is large, she needs a smaller one. Appropriate BP technique discussed. For now no changes in Lisinopril or Amlodipine dose.  DASH/low salt diet to continue. Continue monitoring BP at home.  Stage 3a chronic kidney disease ([place]) Problem has been stable with e GFR > 60 for the past year or so. Continue adequate hydration, low salt diet,and avoidance of NSAID's. Adequate BP controlled.  Need for influenza vaccination -     Flu Vaccine QUAD High Dose(Fluad)  I spent a total of 40 minutes in both face to face and non face to face activities for this visit on the date of this encounter. During this time history was obtained and documented, examination was performed, prior labs/imaging reviewed, and assessment/plan discussed.  Return in about 4 months (around 04/30/2021).  Kayloni Rocco G. Martinique, MD  [facility]. [place] office.

## 2021-06-08 NOTE — Progress Notes (Signed)
Michele Conley is a 76 y.o.female, who is here today for follow up. Last follow up visit: 12/29/20  Hypertension:  Medications: Amlodipine 10 mg daily and Lisinopril 40 mg daily. BP readings at home:140-145/80. Side effects: none Negative for unusual or severe headache, visual changes, exertional chest pain, dyspnea,  focal weakness, or edema.  She decreased salt and sugar intake since her last visit, has lost some wt. Lab Results  Component Value Date   CREATININE 0.86 12/29/2020   BUN 19 12/29/2020   NA 141 12/29/2020   K 4.3 12/29/2020   CL 105 12/29/2020   CO2 27 12/29/2020   GERD: She is on Omeprazole 20 mg , which she takes q 1-2 days. She has tried to stop PPI but develops heartburn. Negative abdominal pain, nausea, vomiting, changes in bowel habits, blood in stool or melena.  Review of Systems  Constitutional:  Negative for activity change, appetite change and fever.  HENT:  Negative for mouth sores, nosebleeds and sore throat.   Genitourinary:  Negative for decreased urine volume and hematuria.  Neurological:  Negative for syncope and facial asymmetry.  Psychiatric/Behavioral:  Negative for confusion. The patient is nervous/anxious.   Rest see pertinent positives and negatives per HPI.  Current Outpatient Medications on File Prior to Visit  Medication Sig Dispense Refill   amLODipine (NORVASC) 10 MG tablet Take 1 tablet (10 mg total) by mouth daily. 90 tablet 1   aspirin 81 MG chewable tablet Chew 81 mg by mouth daily. EVERY OTHER DAY     atorvastatin (LIPITOR) 20 MG tablet TAKE 1 TABLET DAILY (DISCONTINUE 40 MG) 90 tablet 3   Calcium Carbonate-Vitamin D 600-400 MG-UNIT tablet Take 1 tablet by mouth daily.       Coenzyme Q10 (CO Q 10 PO) Take 50 mg by mouth daily.     Cyanocobalamin (VITAMIN B 12 PO) Take 500 mcg by mouth daily.     hydrocortisone (PROCTOSOL HC) 2.5 % rectal cream Place 1 application rectally daily as needed for hemorrhoids or anal itching. 28  g 1   KRILL OIL OMEGA-3 PO Take 1 capsule by mouth daily.     lisinopril (ZESTRIL) 40 MG tablet Take 1 tablet (40 mg total) by mouth daily. 90 tablet 2   No current facility-administered medications on file prior to visit.   Past Medical History:  Diagnosis Date   Arthritis    hands   Breast cancer ([place]) 1996   Right Breast Cancer   BREAST CANCER, HX OF 10/31/2006   Cancer ([place]) 1996   right breast cancer    Chronic kidney disease    stage II pt thinks    Diverticulosis    GERD 10/31/2006   Gout    Hyperlipidemia    HYPERTENSION 10/31/2006   LBBB (left bundle branch block)    OSTEOPOROSIS 10/31/2006   osteopenia per pt not porosis    Personal history of radiation therapy 1996   Right Breast Cancer   Allergies  Allergen Reactions   Simvastatin     REACTION: nausea   Shrimp [Shellfish Allergy] Rash   Social History   Socioeconomic History   Marital status: Married    Spouse name: Not on file   Number of children: Not on file   Years of education: Not on file   Highest education level: Not on file  Occupational History   Not on file  Tobacco Use   Smoking status: Never   Smokeless tobacco: Never  Substance and  Sexual Activity   Alcohol use: Yes    Comment: very rare   Drug use: No   Sexual activity: Not on file  Other Topics Concern   Not on file  Social History Narrative   Married 1969. 2 sons Nathaneil Canary in [place] in [place]. 5 grandkids, 1 greatgranddaughter, 1 great step grandson.       Retired from 69 years with [employer]      Hobbies: travel/sightseeing, time at [employer], [place] with friends. [employer] in [place] Determinants of Health   Financial Resource Strain: Not on file  Food Insecurity: Not on file  Transportation Needs: Not on file  Physical Activity: Not on file  Stress: Not on file  Social Connections: Not on file   Vitals:   06/11/21 0939  BP: (!) 160/80  Pulse: 72  Resp: 16  SpO2: 97%   Wt Readings from  Last 3 Encounters:  06/11/21 131 lb 2 oz (59.5 kg)  12/29/20 135 lb 2 oz (61.3 kg)  10/19/20 135 lb 2 oz (61.3 kg)   Body mass index is 23.98 kg/m.  Physical Exam Vitals and nursing note reviewed.  Constitutional:      General: She is not in acute distress.    Appearance: She is well-developed.  HENT:     Head: Normocephalic and atraumatic.     Mouth/Throat:     Mouth: Mucous membranes are moist.     Pharynx: Oropharynx is clear.  Eyes:     Conjunctiva/sclera: Conjunctivae normal.  Cardiovascular:     Rate and Rhythm: Normal rate and regular rhythm.     Pulses:          Dorsalis pedis pulses are 2+ on the right side and 2+ on the left side.     Heart sounds: No murmur heard. Pulmonary:     Effort: Pulmonary effort is normal. No respiratory distress.     Breath sounds: Normal breath sounds.  Abdominal:     Palpations: Abdomen is soft. There is no hepatomegaly or mass.     Tenderness: There is no abdominal tenderness.  Lymphadenopathy:     Cervical: No cervical adenopathy.  Skin:    General: Skin is warm.     Findings: No erythema or rash.  Neurological:     General: No focal deficit present.     Mental Status: She is alert and oriented to person, place, and time.     Cranial Nerves: No cranial nerve deficit.     Gait: Gait normal.  Psychiatric:     Comments: Well groomed, good eye contact.   ASSESSMENT AND PLAN:   MicheleMichele Conley was seen today for follow-up.  Diagnoses and all orders for this visit:  Orders Placed This Encounter  Procedures   Comprehensive metabolic panel   Lab Results  Component Value Date   CREATININE 0.96 06/11/2021   BUN 15 06/11/2021   NA 140 06/11/2021   K 4.3 06/11/2021   CL 104 06/11/2021   CO2 31 06/11/2021   Lab Results  Component Value Date   ALT 17 06/11/2021   AST 21 06/11/2021   ALKPHOS 121 (H) 06/11/2021   BILITOT 0.5 06/11/2021   GERD Problem is well controlled with Omeprazole 20 mg q 1-2 days. She has tried to stop  medication but symptoms reoccur. No changes in current management. GERD precautions also recommended.  Essential hypertension BP has been mildly elevated at home. She usually gets higher BP readings in office, attributed to "  white coat" synd. Re-checked 165/80. HCTZ 25 mg 1/2 tab added today. No changes in Lisinopril or Amlodipine dose. Continue low salt diet and monitor BP, instructed to let me know about BP readings in 3-4 weeks, if still > 140/90, she was instructed to take the whole tab. Side effect discussed. She is due for her eye exam.  CKD (chronic kidney disease), stage III ([place]) For the past few months e GFR has been > 60. Continue adequate hydration, low salt diet,avoidance of NSAID's,and adequate BP controlled. Continue Lisinopril.  Return in about 6 months (around 12/09/2021) for HTN,HLD.  Neda Willenbring G. Martin[place], MD  [facility]. [place] office.

## 2021-06-11 ENCOUNTER — Encounter: Payer: Self-pay | Admitting: Family Medicine

## 2021-06-11 ENCOUNTER — Ambulatory Visit (INDEPENDENT_AMBULATORY_CARE_PROVIDER_SITE_OTHER): Payer: Medicare Other | Admitting: Family Medicine

## 2021-06-11 VITALS — BP 160/80 | HR 72 | Resp 16 | Ht 62.0 in | Wt 131.1 lb

## 2021-06-11 DIAGNOSIS — K219 Gastro-esophageal reflux disease without esophagitis: Secondary | ICD-10-CM | POA: Diagnosis not present

## 2021-06-11 DIAGNOSIS — N1831 Chronic kidney disease, stage 3a: Secondary | ICD-10-CM

## 2021-06-11 DIAGNOSIS — I10 Essential (primary) hypertension: Secondary | ICD-10-CM

## 2021-06-11 LAB — COMPREHENSIVE METABOLIC PANEL
ALT: 17 U/L (ref 0–35)
AST: 21 U/L (ref 0–37)
Albumin: 4.5 g/dL (ref 3.5–5.2)
Alkaline Phosphatase: 121 U/L — ABNORMAL HIGH (ref 39–117)
BUN: 15 mg/dL (ref 6–23)
CO2: 31 mEq/L (ref 19–32)
Calcium: 9.8 mg/dL (ref 8.4–10.5)
Chloride: 104 mEq/L (ref 96–112)
Creatinine, Ser: 0.96 mg/dL (ref 0.40–1.20)
GFR: 57.66 mL/min — ABNORMAL LOW (ref >60.00)
Glucose, Bld: 98 mg/dL (ref 70–99)
Potassium: 4.3 mEq/L (ref 3.5–5.1)
Sodium: 140 mEq/L (ref 135–145)
Total Bilirubin: 0.5 mg/dL (ref 0.2–1.2)
Total Protein: 7.6 g/dL (ref 6.0–8.3)

## 2021-06-11 MED ORDER — HYDROCHLOROTHIAZIDE 25 MG PO TABS: 25.0000 mg | ORAL_TABLET | Freq: Every day | ORAL | 1 refills | Status: DC

## 2021-06-11 MED ORDER — OMEPRAZOLE 20 MG PO CPDR: 20.0000 mg | DELAYED_RELEASE_CAPSULE | Freq: Every day | ORAL | 3 refills | Status: DC

## 2021-06-11 NOTE — Assessment & Plan Note (Signed)
BP has been mildly elevated at home. She usually gets higher BP readings in office, attributed to "white coat" synd. Re-checked 165/80. HCTZ 25 mg 1/2 tab added today. No changes in Lisinopril or Amlodipine dose. Continue low salt diet and monitor BP, instructed to let me know about BP readings in 3-4 weeks, if still > 140/90, she was instructed to take the whole tab. Side effect discussed. She is due for her eye exam.

## 2021-06-11 NOTE — Assessment & Plan Note (Signed)
For the past few months e GFR has been > 60. Continue adequate hydration, low salt diet,avoidance of NSAID's,and adequate BP controlled. Continue Lisinopril.

## 2021-06-11 NOTE — Assessment & Plan Note (Signed)
Problem is well controlled with Omeprazole 20 mg q 1-2 days. She has tried to stop medication but symptoms reoccur. No changes in current management. GERD precautions also recommended.

## 2021-06-11 NOTE — Patient Instructions (Addendum)
A few things to remember from today's visit:   Essential hypertension - Plan: Comprehensive metabolic panel  Stage 3a chronic kidney disease ([place]) - Plan: Comprehensive metabolic panel  Gastroesophageal reflux disease without esophagitis - Plan: omeprazole (PRILOSEC) 20 MG capsule  If you need refills please call your pharmacy. Do not use My Chart to request refills or for acute issues that need immediate attention.   Today we added HCTZ 25 mg, start with 1/2 tab in the morning with Lisinopril. Continue Amlodipine 10 mg at bedtime. Let me know about blood pressure readings in 3-4 weeks, we can increase to the whole tab if needed.   Please be sure medication list is accurate. If a new problem present, please set up appointment sooner than planned today.

## 2021-07-23 ENCOUNTER — Telehealth: Payer: Self-pay | Admitting: Family Medicine

## 2021-07-23 NOTE — Telephone Encounter (Signed)
Left message for patient to call back and schedule Medicare Annual Wellness Visit (AWV) either virtually or in office. Left  my Herbie Drape number [phone] ? ? ?Last AWV ; 06/20/20 ?please schedule at anytime with [facility] Nurse Health Advisor 1 or 2 ? ? ? ?

## 2021-07-24 ENCOUNTER — Ambulatory Visit (INDEPENDENT_AMBULATORY_CARE_PROVIDER_SITE_OTHER): Payer: Medicare Other

## 2021-07-24 VITALS — Ht 62.0 in | Wt 131.0 lb

## 2021-07-24 DIAGNOSIS — Z0000 Encounter for general adult medical examination without abnormal findings: Secondary | ICD-10-CM

## 2021-07-24 NOTE — Patient Instructions (Addendum)
?Michele Conley , ?Thank you for taking time to come for your Medicare Wellness Visit. I appreciate your ongoing commitment to your health goals. Please review the following plan we discussed and let me know if I can assist you in the future.  ? ?These are the goals we discussed: ? Goals   ? ?   Exercise 3x per week (30 min per time)   ?   10-15 min of daily walking. ?Walking in place at home during cold weather and /or Tai chi. ?Continue fall prevention. ?  ?   Increase physical activity (pt-stated)   ? ?  ? ?Advanced directives: No  ? ?Conditions/risks identified: None ? ?Next appointment: Follow up in one year for your annual wellness visit  ? ? ? ?Preventive Care 4 Years and Older, Female ?Preventive care refers to lifestyle choices and visits with your health care provider that can promote health and wellness. ?What does preventive care include? ?A yearly physical exam. This is also called an annual well check. ?Dental exams once or twice a year. ?Routine eye exams. Ask your health care provider how often you should have your eyes checked. ?Personal lifestyle choices, including: ?Daily care of your teeth and gums. ?Regular physical activity. ?Eating a healthy diet. ?Avoiding tobacco and drug use. ?Limiting alcohol use. ?Practicing safe sex. ?Taking low-dose aspirin every day. ?Taking vitamin and mineral supplements as recommended by your health care provider. ?What happens during an annual well check? ?The services and screenings done by your health care provider during your annual well check will depend on your age, overall health, lifestyle risk factors, and family history of disease. ?Counseling  ?Your health care provider may ask you questions about your: ?Alcohol use. ?Tobacco use. ?Drug use. ?Emotional well-being. ?Home and relationship well-being. ?Sexual activity. ?Eating habits. ?History of falls. ?Memory and ability to understand (cognition). ?Work and work Statistician. ?Reproductive health. ?Screening   ?You may have the following tests or measurements: ?Height, weight, and BMI. ?Blood pressure. ?Lipid and cholesterol levels. These may be checked every 5 years, or more frequently if you are over 40 years old. ?Skin check. ?Lung cancer screening. You may have this screening every year starting at age 72 if you have a 30-pack-year history of smoking and currently smoke or have quit within the past 15 years. ?Fecal occult blood test (FOBT) of the stool. You may have this test every year starting at age 30. ?Flexible sigmoidoscopy or colonoscopy. You may have a sigmoidoscopy every 5 years or a colonoscopy every 10 years starting at age 66. ?Hepatitis C blood test. ?Hepatitis B blood test. ?Sexually transmitted disease (STD) testing. ?Diabetes screening. This is done by checking your blood sugar (glucose) after you have not eaten for a while (fasting). You may have this done every 1-3 years. ?Bone density scan. This is done to screen for osteoporosis. You may have this done starting at age 34. ?Mammogram. This may be done every 1-2 years. Talk to your health care provider about how often you should have regular mammograms. ?Talk with your health care provider about your test results, treatment options, and if necessary, the need for more tests. ?Vaccines  ?Your health care provider may recommend certain vaccines, such as: ?Influenza vaccine. This is recommended every year. ?Tetanus, diphtheria, and acellular pertussis (Tdap, Td) vaccine. You may need a Td booster every 10 years. ?Zoster vaccine. You may need this after age 90. ?Pneumococcal 13-valent conjugate (PCV13) vaccine. One dose is recommended after age 66. ?Pneumococcal polysaccharide (PPSV23)  vaccine. One dose is recommended after age 8. ?Talk to your health care provider about which screenings and vaccines you need and how often you need them. ?This information is not intended to replace advice given to you by your health care provider. Make sure you discuss  any questions you have with your health care provider. ?Document Released: 05/05/2015 Document Revised: 12/27/2015 Document Reviewed: 02/07/2015 ?Elsevier Interactive Patient Education ? 2017 [place]. ? ?Fall Prevention in the Home ?Falls can cause injuries. They can happen to people of all ages. There are many things you can do to make your home safe and to help prevent falls. ?What can I do on the outside of my home? ?Regularly fix the edges of walkways and driveways and fix any cracks. ?Remove anything that might make you trip as you walk through a door, such as a raised step or threshold. ?Trim any bushes or trees on the path to your home. ?Use bright outdoor lighting. ?Clear any walking paths of anything that might make someone trip, such as rocks or tools. ?Regularly check to see if handrails are loose or broken. Make sure that both sides of any steps have handrails. ?Any raised decks and porches should have guardrails on the edges. ?Have any leaves, snow, or ice cleared regularly. ?Use sand or salt on walking paths during winter. ?Clean up any spills in your garage right away. This includes oil or grease spills. ?What can I do in the bathroom? ?Use night lights. ?Install grab bars by the toilet and in the tub and shower. Do not use towel bars as grab bars. ?Use non-skid mats or decals in the tub or shower. ?If you need to sit down in the shower, use a plastic, non-slip stool. ?Keep the floor dry. Clean up any water that spills on the floor as soon as it happens. ?Remove soap buildup in the tub or shower regularly. ?Attach bath mats securely with double-sided non-slip rug tape. ?Do not have throw rugs and other things on the floor that can make you trip. ?What can I do in the bedroom? ?Use night lights. ?Make sure that you have a light by your bed that is easy to reach. ?Do not use any sheets or blankets that are too big for your bed. They should not hang down onto the floor. ?Have a firm chair that has  side arms. You can use this for support while you get dressed. ?Do not have throw rugs and other things on the floor that can make you trip. ?What can I do in the kitchen? ?Clean up any spills right away. ?Avoid walking on wet floors. ?Keep items that you use a lot in easy-to-reach places. ?If you need to reach something above you, use a strong step stool that has a grab bar. ?Keep electrical cords out of the way. ?Do not use floor polish or wax that makes floors slippery. If you must use wax, use non-skid floor wax. ?Do not have throw rugs and other things on the floor that can make you trip. ?What can I do with my stairs? ?Do not leave any items on the stairs. ?Make sure that there are handrails on both sides of the stairs and use them. Fix handrails that are broken or loose. Make sure that handrails are as long as the stairways. ?Check any carpeting to make sure that it is firmly attached to the stairs. Fix any carpet that is loose or worn. ?Avoid having throw rugs at the top or bottom  of the stairs. If you do have throw rugs, attach them to the floor with carpet tape. ?Make sure that you have a light switch at the top of the stairs and the bottom of the stairs. If you do not have them, ask someone to add them for you. ?What else can I do to help prevent falls? ?Wear shoes that: ?Do not have high heels. ?Have rubber bottoms. ?Are comfortable and fit you well. ?Are closed at the toe. Do not wear sandals. ?If you use a stepladder: ?Make sure that it is fully opened. Do not climb a closed stepladder. ?Make sure that both sides of the stepladder are locked into place. ?Ask someone to hold it for you, if possible. ?Clearly mark and make sure that you can see: ?Any grab bars or handrails. ?First and last steps. ?Where the edge of each step is. ?Use tools that help you move around (mobility aids) if they are needed. These include: ?Canes. ?Walkers. ?Scooters. ?Crutches. ?Turn on the lights when you go into a dark area.  Replace any light bulbs as soon as they burn out. ?Set up your furniture so you have a clear path. Avoid moving your furniture around. ?If any of your floors are uneven, fix them. ?If there are any pe

## 2021-07-24 NOTE — Progress Notes (Signed)
? ?Subjective:  ? Michele Conley is a 76 y.o. female who presents for Medicare Annual (Subsequent) preventive examination. ? ?Review of Systems    ?Virtual Visit via Telephone Note ? ?I connected with  Michele Conley on 07/24/21 at  1:00 PM EDT by telephone and verified that I am speaking with the correct person using two identifiers. ? ?Location: ?Patient: Home ?Provider: Office ?Persons participating in the virtual visit: patient/Nurse Health Advisor ?  ?I discussed the limitations, risks, security and privacy concerns of performing an evaluation and management service by telephone and the availability of in person appointments. The patient expressed understanding and agreed to proceed. ? ?Interactive audio and video telecommunications were attempted between this nurse and patient, however failed, due to patient having technical difficulties OR patient did not have access to video capability.  We continued and completed visit with audio only. ? ?Some vital signs may be absent or patient reported.  ? ?Michele Peaches, LPN  ?Cardiac Risk Factors include: advanced age (>84mn, >>3women);hypertension ? ?   ?Objective:  ?  ?Today's Vitals  ? 07/24/21 1312  ?Weight: 131 lb (59.4 kg)  ?Height: '5\' 2"'$  (1.575 m)  ? ?Body mass index is 23.96 kg/m?. ? ? ?  07/24/2021  ?  1:19 PM 12/26/2016  ?  8:26 AM  ?Advanced Directives  ?Does Patient Have a Medical Advance Directive? No No  ?Would patient like information on creating a medical advance directive? No - Patient declined   ? ? ?Current Medications (verified) ?Outpatient Encounter Medications as of 07/24/2021  ?Medication Sig  ? amLODipine (NORVASC) 10 MG tablet Take 1 tablet (10 mg total) by mouth daily.  ? aspirin 81 MG chewable tablet Chew 81 mg by mouth daily. EVERY OTHER DAY  ? atorvastatin (LIPITOR) 20 MG tablet TAKE 1 TABLET DAILY (DISCONTINUE 40 MG)  ? Calcium Carbonate-Vitamin D 600-400 MG-UNIT tablet Take 1 tablet by mouth daily.    ? Coenzyme Q10 (CO Q 10 PO) Take  50 mg by mouth daily.  ? Cyanocobalamin (VITAMIN B 12 PO) Take 500 mcg by mouth daily.  ? hydrochlorothiazide (HYDRODIURIL) 25 MG tablet Take 1 tablet (25 mg total) by mouth daily.  ? hydrocortisone (PROCTOSOL HC) 2.5 % rectal cream Place 1 application rectally daily as needed for hemorrhoids or anal itching.  ? KRILL OIL OMEGA-3 PO Take 1 capsule by mouth daily.  ? lisinopril (ZESTRIL) 40 MG tablet Take 1 tablet (40 mg total) by mouth daily.  ? omeprazole (PRILOSEC) 20 MG capsule Take 1 capsule (20 mg total) by mouth daily.  ? ?No facility-administered encounter medications on file as of 07/24/2021.  ? ? ?Allergies (verified) ?Simvastatin and Shrimp [shellfish allergy]  ? ?History: ?Past Medical History:  ?Diagnosis Date  ? Arthritis   ? hands  ? [place]  ? Right Breast Cancer  ? BREAST CANCER, HX OF 10/31/2006  ? Cancer ([facility] 1996  ? right breast cancer   ? Chronic kidney disease   ? stage II pt thinks   ? Diverticulosis   ? GERD 10/31/2006  ? Gout   ? Hyperlipidemia   ? HYPERTENSION 10/31/2006  ? LBBB (left bundle branch block)   ? OSTEOPOROSIS 10/31/2006  ? osteopenia per pt not porosis   ? Personal history of radiation therapy 1996  ? Right Breast Cancer  ? ?Past Surgical History:  ?Procedure Laterality Date  ? BREAST LUMPECTOMY Right 1996  ? followed by radiation and tamoxifen X 5 years  ?  COLONOSCOPY  2007  ? ?Family History  ?Problem Relation Age of Onset  ? Heart disease Mother   ? Stroke Mother   ?     CVA mother age 34  ? Breast cancer Mother   ? Arthritis Other   ? Cancer Other   ?     breast < age 77  ? Diabetes Other   ? Hypertension Other   ? Kidney disease Other   ? Breast cancer Sister   ? Breast cancer Maternal Grandmother   ? Breast cancer Sister   ?     x2  ? Colon cancer Neg Hx   ? Colon polyps Neg Hx   ? Rectal cancer Neg Hx   ? Stomach cancer Neg Hx   ? Esophageal cancer Neg Hx   ? ?Social History  ? ?Socioeconomic History  ? Marital status: Married  ?  Spouse name: Not on file  ?  Number of children: Not on file  ? Years of education: Not on file  ? Highest education level: Not on file  ?Occupational History  ? Not on file  ?Tobacco Use  ? Smoking status: Never  ? Smokeless tobacco: Never  ?Substance and Sexual Activity  ? Alcohol use: Yes  ?  Comment: very rare  ? Drug use: No  ? Sexual activity: Not on file  ?Other Topics Concern  ? Not on file  ?Social History Narrative  ? Married 1969. 2 sons Nathaneil Canary in [place] in [place]. 5 grandkids, 1 greatgranddaughter, 1 great step grandson.   ?   ? Retired from 33 years with [employer]  ?   ? Hobbies: travel/sightseeing, time at [employer], [place] with friends. [employer] in [place]  ? ?Social Determinants of Health  ? ?Financial Resource Strain: Low Risk   ? Difficulty of Paying Living Expenses: Not hard at all  ?Food Insecurity: No Food Insecurity  ? Worried About Charity fundraiser in the Last Year: Never true  ? Ran Out of Food in the Last Year: Never true  ?Transportation Needs: No Transportation Needs  ? Lack of Transportation (Medical): No  ? Lack of Transportation (Non-Medical): No  ?Physical Activity: Inactive  ? Days of Exercise per Week: 0 days  ? Minutes of Exercise per Session: 0 min  ?Stress: No Stress Concern Present  ? Feeling of Stress : Not at all  ?Social Connections: Socially Isolated  ? Frequency of Communication with Friends and Family: More than three times a week  ? Frequency of Social Gatherings with Friends and Family: More than three times a week  ? Attends Religious Services: Never  ? Active Member of Clubs or Organizations: No  ? Attends Archivist Meetings: Never  ? Marital Status: Widowed  ? ? ? ?Clinical Intake: ? ? ? ?Diabetic?  No ? ?Interpreter Needed?: NoActivities of Daily Living ? ?  07/24/2021  ?  1:17 PM  ?In your present state of health, do you have any difficulty performing the following activities:  ?Hearing? 0  ?Vision? 0  ?Difficulty concentrating or making decisions? 0  ?Walking  or climbing stairs? 0  ?Dressing or bathing? 0  ?Doing errands, shopping? 0  ?Preparing Food and eating ? N  ?Using the Toilet? N  ?In the past six months, have you accidently leaked urine? N  ?Do you have problems with loss of bowel control? N  ?Managing your Medications? N  ?Managing your Finances? N  ?Housekeeping or managing your Housekeeping? N  ? ? ?  Patient Care Team: ?Martinique, Betty G, MD as PCP - General (Family Medicine) ? ?Indicate any recent Medical Services you may have received from other than [facility] providers in the past year (date may be approximate). ? ?   ?Assessment:  ? This is a routine wellness examination for [place]. ? ?Hearing/Vision screen ?Hearing Screening - Comments:: No hearing difficulty ?Vision Screening - Comments:: Wears glasses, followed by Dr Delman Cheadle ? ?Dietary issues and exercise activities discussed: ?Exercise limited by: None identified ? ? Goals Addressed   ? ?  ?  ?  ?  ?  ? This Visit's Progress  ?   Increase physical activity (pt-stated)     ? ?  ? ?Depression Screen ? ?  07/24/2021  ?  1:15 PM 06/11/2021  ?  9:49 AM 06/20/2020  ? 11:55 AM 10/27/2017  ?  8:51 AM 10/21/2016  ?  8:06 AM 10/20/2015  ?  8:11 AM 07/18/2014  ? 10:56 AM  ?PHQ 2/9 Scores  ?PHQ - 2 Score 0 0 0 0 0 0 0  ?  ?Fall Risk ? ?  07/24/2021  ?  1:18 PM 06/20/2020  ? 11:55 AM 03/09/2018  ?  3:14 PM 10/21/2016  ?  8:06 AM 10/20/2015  ?  8:11 AM  ?Fall Risk   ?Falls in the past year? 0 0 0 No No  ?Comment   Emmi Telephone Survey: data to providers prior to load    ?Number falls in past yr: 0 0     ?Injury with Fall? 0 0     ?Risk for fall due to : No Fall Risks      ?Follow up  Education provided     ? ? ?FALL RISK PREVENTION PERTAINING TO THE HOME: ? ?Any stairs in or around the home? Yes  ?If so, are there any without handrails? No  ?Home free of loose throw rugs in walkways, pet beds, electrical cords, etc? Yes  ?Adequate lighting in your home to reduce risk of falls? Yes  ? ?ASSISTIVE DEVICES UTILIZED TO PREVENT FALLS: ? ?Life  alert? No  ?Use of a cane, walker or w/c? No  ?Grab bars in the bathroom? No  ?Shower chair or bench in shower? No  ?Elevated toilet seat or a handicapped toilet? Yes  ? ?TIMED UP AND GO: ? ?Was the test

## 2021-12-10 ENCOUNTER — Other Ambulatory Visit: Payer: Self-pay | Admitting: Family Medicine

## 2021-12-10 DIAGNOSIS — I10 Essential (primary) hypertension: Secondary | ICD-10-CM

## 2021-12-10 DIAGNOSIS — E785 Hyperlipidemia, unspecified: Secondary | ICD-10-CM

## 2021-12-10 NOTE — Progress Notes (Unsigned)
No chief complaint on file. HPI: [name] is a 76 y.o. female, who is here today for chronic disease management. Last seen on ***  Hypertension:  Medications:Lisinopril 40 mg daily,Amlodipine 10 mg daily,and HCTZ 25 mg daily. BP readings at home:*** Side effects:***  Negative for unusual or severe headache, visual changes, exertional chest pain, dyspnea,  focal weakness, or edema.  Lab Results  Component Value Date   CREATININE 0.96 06/11/2021   BUN 15 06/11/2021   NA 140 06/11/2021   K 4.3 06/11/2021   CL 104 06/11/2021   CO2 31 06/11/2021   Hyperlipidemia: Currently on Atorvastatin 20 mg daily. Side effects from medication:*** Lab Results  Component Value Date   CHOL 204 (H) 10/19/2020   HDL 72.20 10/19/2020   LDLCALC 98 10/19/2020   LDLDIRECT 122.0 10/21/2016   TRIG 171.0 (H) 10/19/2020   CHOLHDL 3 10/19/2020    Review of Systems Rest of ROS see pertinent positives and negatives in HPI.  Current Outpatient Medications on File Prior to Visit  Medication Sig Dispense Refill   amLODipine (NORVASC) 10 MG tablet Take 1 tablet (10 mg total) by mouth daily. 90 tablet 1   aspirin 81 MG chewable tablet Chew 81 mg by mouth daily. EVERY OTHER DAY     atorvastatin (LIPITOR) 20 MG tablet TAKE 1 TABLET DAILY (DISCONTINUE 40 MG) 90 tablet 3   Calcium Carbonate-Vitamin D 600-400 MG-UNIT tablet Take 1 tablet by mouth daily.       Coenzyme Q10 (CO Q 10 PO) Take 50 mg by mouth daily.     Cyanocobalamin (VITAMIN B 12 PO) Take 500 mcg by mouth daily.     hydrochlorothiazide (HYDRODIURIL) 25 MG tablet Take 1 tablet (25 mg total) by mouth daily. 90 tablet 1   hydrocortisone (PROCTOSOL HC) 2.5 % rectal cream Place 1 application rectally daily as needed for hemorrhoids or anal itching. 28 g 1   KRILL OIL OMEGA-3 PO Take 1 capsule by mouth daily.     lisinopril (ZESTRIL) 40 MG tablet Take 1 tablet (40 mg total) by mouth daily. 90 tablet 2   omeprazole (PRILOSEC) 20 MG capsule  Take 1 capsule (20 mg total) by mouth daily. 90 capsule 3   No current facility-administered medications on file prior to visit.    Past Medical History:  Diagnosis Date   Arthritis    hands   Breast cancer ([place]) 1996   Right Breast Cancer   BREAST CANCER, HX OF 10/31/2006   Cancer ([place]) 1996   right breast cancer    Chronic kidney disease    stage II pt thinks    Diverticulosis    GERD 10/31/2006   Gout    Hyperlipidemia    HYPERTENSION 10/31/2006   LBBB (left bundle branch block)    OSTEOPOROSIS 10/31/2006   osteopenia per pt not porosis    Personal history of radiation therapy 1996   Right Breast Cancer    Allergies  Allergen Reactions   Simvastatin     REACTION: nausea   Shrimp [Shellfish Allergy] Rash    Social History   Socioeconomic History   Marital status: Married    Spouse name: Not on file   Number of children: Not on file   Years of education: Not on file   Highest education level: Not on file  Occupational History   Not on file  Tobacco Use   Smoking status: Never   Smokeless tobacco: Never  Substance and Sexual Activity  Alcohol use: Yes    Comment: very rare   Drug use: No   Sexual activity: Not on file  Other Topics Concern   Not on file  Social History Narrative   Married 1969. 2 sons Michele Conley in [place] in [place]. 5 grandkids, 1 greatgranddaughter, 1 great step grandson.       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 [employer] in [place] Determinants of Health   Financial Resource Strain: Low Risk  (07/24/2021)   Overall Financial Resource Strain (CARDIA)    Difficulty of Paying Living Expenses: Not hard at all  Food Insecurity: No Food Insecurity (07/24/2021)   Hunger Vital Sign    Worried About Running Out of Food in the Last Year: Never true    Ran Out of Food in the Last Year: Never true  Transportation Needs: No Transportation Needs  (07/24/2021)   PRAPARE - Hydrologist (Medical): No    Lack of Transportation (Non-Medical): No  Physical Activity: Inactive (07/24/2021)   Exercise Vital Sign    Days of Exercise per Week: 0 days    Minutes of Exercise per Session: 0 min  Stress: No Stress Concern Present (07/24/2021)   [place]    Feeling of Stress : Not at all  Social Connections: Socially Isolated (07/24/2021)   Social Connection and Isolation Panel [NHANES]    Frequency of Communication with Friends and Family: More than three times a week    Frequency of Social Gatherings with Friends and Family: More than three times a week    Attends Religious Services: Never    Marine scientist or Organizations: No    Attends Archivist Meetings: Never    Marital Status: Widowed    There were no vitals filed for this visit.  There is no height or weight on file to calculate BMI.  Physical Exam  ASSESSMENT AND PLAN:  Diagnoses and all orders for this visit:  Stage 3a chronic kidney disease ([place])  Vitamin D deficiency    No orders of the defined types were placed in this encounter.   No problem-specific Assessment & Plan notes found for this encounter.   No follow-ups on file.   Astin Rape Martinique, MD [facility]. [place] office.

## 2021-12-11 ENCOUNTER — Encounter: Payer: Self-pay | Admitting: Family Medicine

## 2021-12-11 ENCOUNTER — Ambulatory Visit (INDEPENDENT_AMBULATORY_CARE_PROVIDER_SITE_OTHER): Payer: Medicare Other | Admitting: Family Medicine

## 2021-12-11 VITALS — BP 130/70 | HR 60 | Temp 98.1°F | Resp 12 | Ht 62.0 in | Wt 133.1 lb

## 2021-12-11 DIAGNOSIS — E559 Vitamin D deficiency, unspecified: Secondary | ICD-10-CM | POA: Diagnosis not present

## 2021-12-11 DIAGNOSIS — N1831 Chronic kidney disease, stage 3a: Secondary | ICD-10-CM | POA: Diagnosis not present

## 2021-12-11 DIAGNOSIS — I10 Essential (primary) hypertension: Secondary | ICD-10-CM

## 2021-12-11 DIAGNOSIS — E785 Hyperlipidemia, unspecified: Secondary | ICD-10-CM

## 2021-12-11 DIAGNOSIS — R748 Abnormal levels of other serum enzymes: Secondary | ICD-10-CM | POA: Diagnosis not present

## 2021-12-11 LAB — BASIC METABOLIC PANEL
BUN: 16 mg/dL (ref 6–23)
CO2: 26 mEq/L (ref 19–32)
Calcium: 9.5 mg/dL (ref 8.4–10.5)
Chloride: 106 mEq/L (ref 96–112)
Creatinine, Ser: 0.74 mg/dL (ref 0.40–1.20)
GFR: 78.52 mL/min (ref >60.00)
Glucose, Bld: 92 mg/dL (ref 70–99)
Potassium: 4.6 mEq/L (ref 3.5–5.1)
Sodium: 140 mEq/L (ref 135–145)

## 2021-12-11 LAB — LIPID PANEL
Cholesterol: 196 mg/dL (ref 0–200)
HDL: 79.2 mg/dL (ref >39.00)
LDL Cholesterol: 88 mg/dL (ref 0–99)
NonHDL: 116.83
Total CHOL/HDL Ratio: 2
Triglycerides: 146 mg/dL (ref 0.0–149.0)
VLDL: 29.2 mg/dL (ref 0.0–40.0)

## 2021-12-11 LAB — HEPATIC FUNCTION PANEL
ALT: 14 U/L (ref 0–35)
AST: 17 U/L (ref 0–37)
Albumin: 4.4 g/dL (ref 3.5–5.2)
Alkaline Phosphatase: 95 U/L (ref 39–117)
Bilirubin, Direct: 0.1 mg/dL (ref 0.0–0.3)
Total Bilirubin: 0.5 mg/dL (ref 0.2–1.2)
Total Protein: 7.4 g/dL (ref 6.0–8.3)

## 2021-12-11 LAB — VITAMIN D 25 HYDROXY (VIT D DEFICIENCY, FRACTURES): VITD: 32.46 ng/mL (ref 30.00–100.00)

## 2021-12-11 LAB — MICROALBUMIN / CREATININE URINE RATIO
Creatinine,U: 99.6 mg/dL
Microalb Creat Ratio: 4.3 mg/g (ref 0.0–30.0)
Microalb, Ur: 4.3 mg/dL — ABNORMAL HIGH (ref 0.0–1.9)

## 2021-12-11 LAB — GAMMA GT: GGT: 39 U/L (ref 7–51)

## 2021-12-11 MED ORDER — ATORVASTATIN CALCIUM 20 MG PO TABS: ORAL_TABLET | ORAL | 3 refills | Status: DC

## 2021-12-11 MED ORDER — HYDROCHLOROTHIAZIDE 25 MG PO TABS
25.0000 mg | ORAL_TABLET | Freq: Every day | ORAL | 1 refills | Status: DC
Start: 2021-12-11 — End: 2023-01-10

## 2021-12-11 NOTE — Assessment & Plan Note (Signed)
Continue same dose of Vit D supplementation. Further recommendations according to 25 OH vit D result.

## 2021-12-11 NOTE — Assessment & Plan Note (Signed)
Problem has been stable. Continue low salt diet,adequate hydration and BP controlled, as well as NSAID's avoidance.

## 2021-12-11 NOTE — Assessment & Plan Note (Signed)
BP adequately controlled. Continue current management: Lisinopril 40 mg daily,Amlodipine 10 mg daily,and HCTZ 25 mg daily. DASH/low salt diet to continue. Continue monitoring BP at home.

## 2021-12-11 NOTE — Assessment & Plan Note (Signed)
Continue Atorvastatin 20 mg daily. Low fat diet also recommended. Further recommendations will be given according to FLP result.

## 2021-12-11 NOTE — Patient Instructions (Signed)
A few things to remember from today's visit:   Vitamin D deficiency - Plan: VITAMIN D 25 Hydroxy (Vit-D Deficiency, Fractures)  Hyperlipidemia, unspecified hyperlipidemia type - Plan: Lipid panel  Elevated alkaline phosphatase level - Plan: Hepatic function panel, Gamma GT  Essential hypertension - Plan: Basic metabolic panel  Stage 3a chronic kidney disease ([facility]) - Plan: Basic metabolic panel, Microalbumin / creatinine urine ratio  If you need refills please call your pharmacy. Do not use My Chart to request refills or for acute issues that need immediate attention.   No changes today.  Please be sure medication list is accurate. If a new problem present, please set up appointment sooner than planned today.

## 2022-01-06 IMAGING — MG MM DIGITAL SCREENING BILAT W/ TOMO AND CAD
8 series · 9 of 24 positions shown · non-contrast
Comparison: Previous exam(s).

CLINICAL DATA: Screening.

EXAM:
DIGITAL SCREENING BILATERAL MAMMOGRAM WITH TOMOSYNTHESIS AND CAD
TECHNIQUE: Bilateral screening digital craniocaudal and mediolateral oblique
mammograms were obtained. Bilateral screening digital breast
tomosynthesis was performed. The images were evaluated with
computer-aided detection.

[R CC synth-2D]
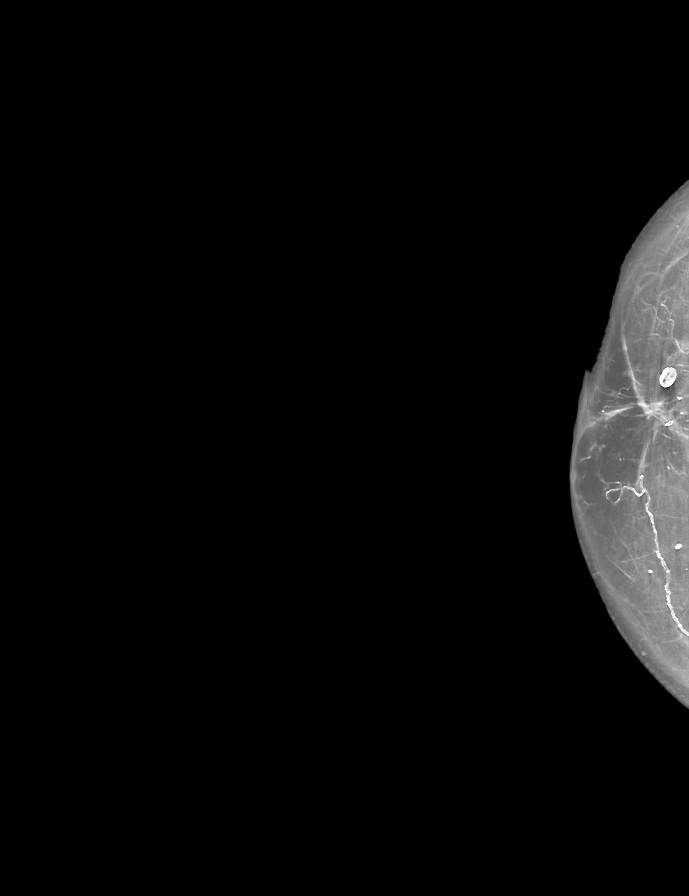

[L CC synth-2D]
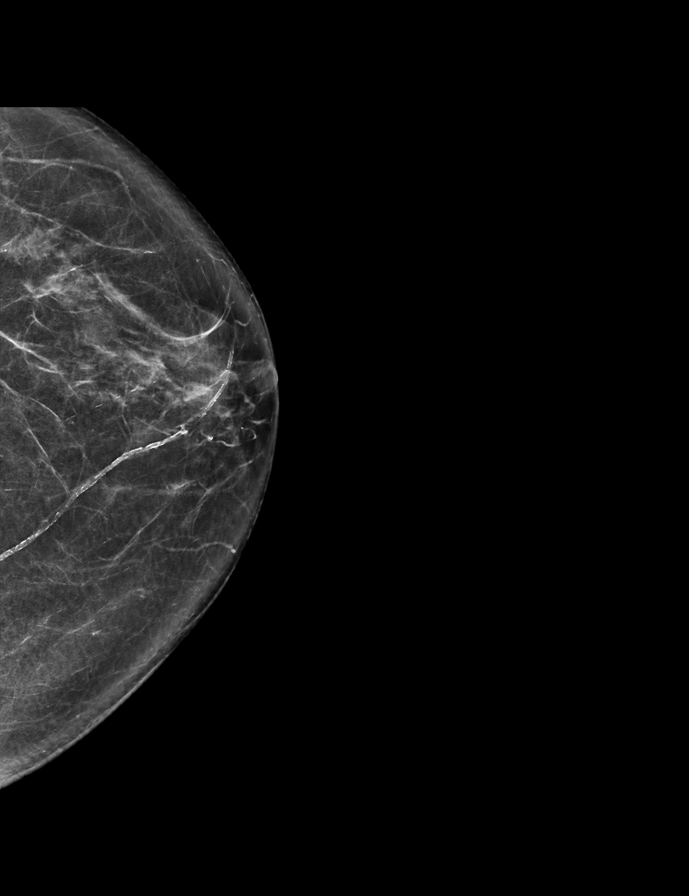

[L MLO synth-2D]
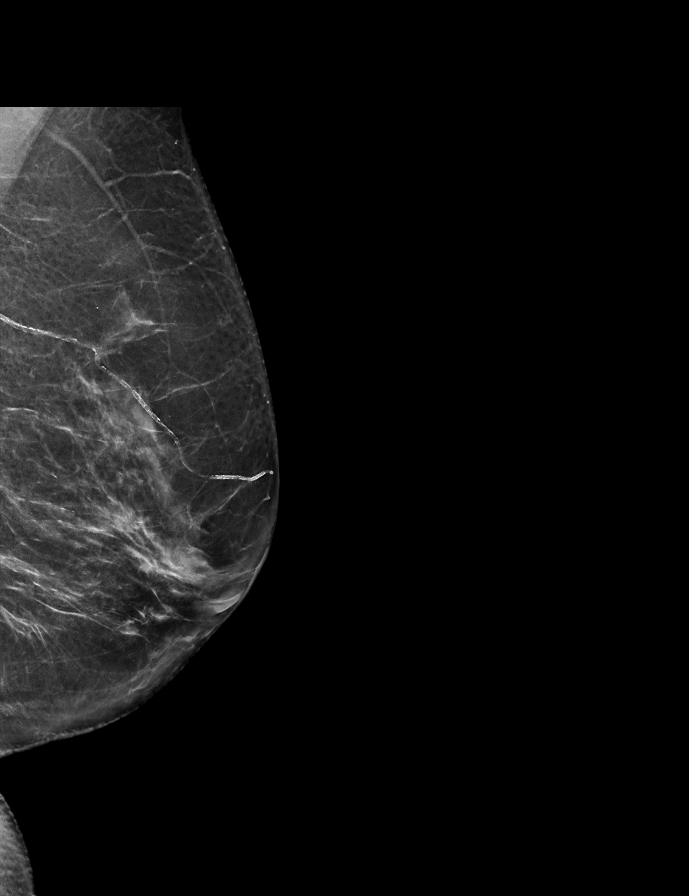

[R MLO synth-2D]
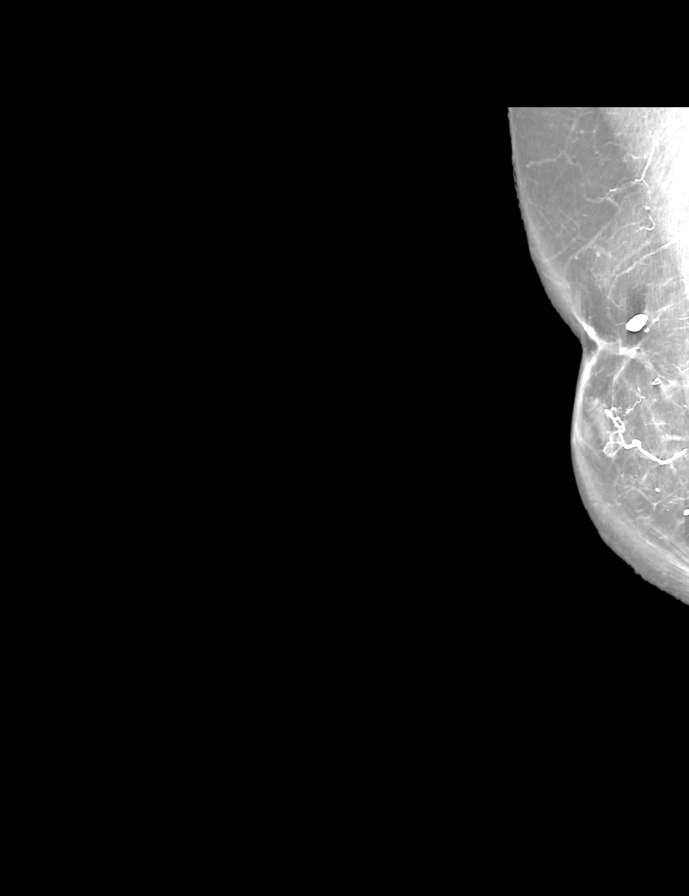

[R CC tomo · 2 of 67 frames shown]
[frame 22/67]
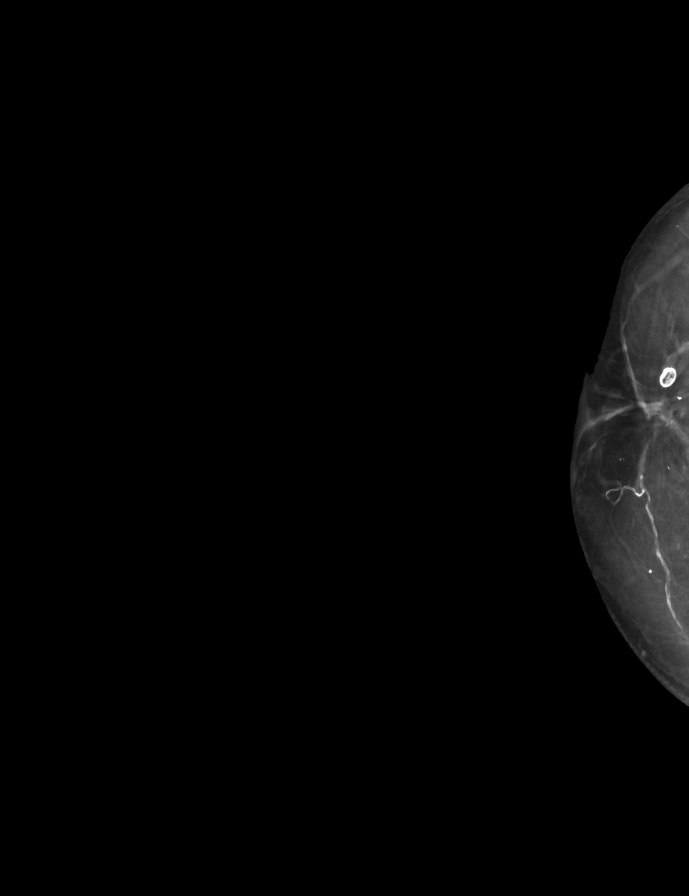
[frame 34/67]
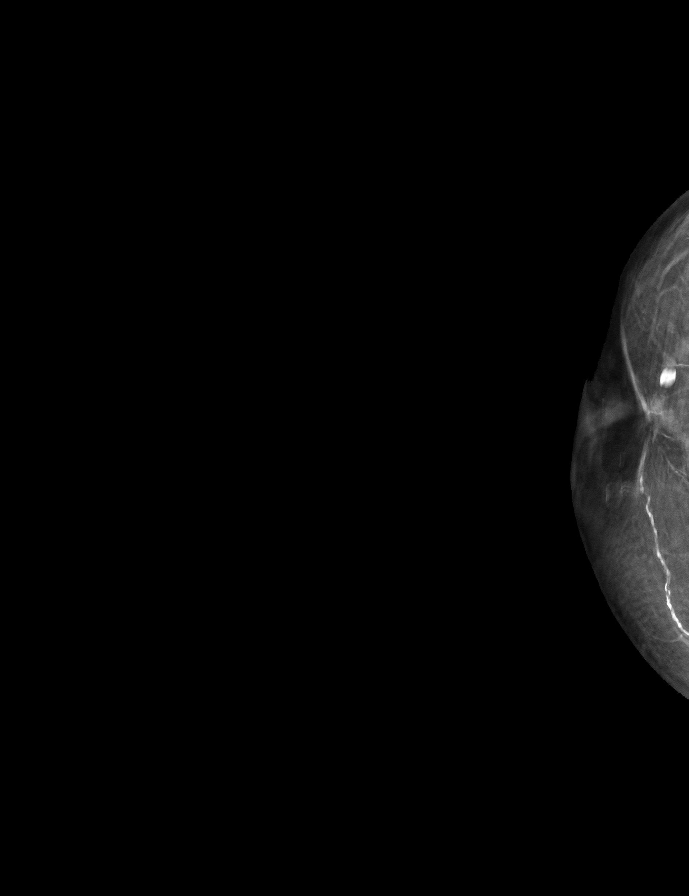

[L CC tomo · tomo slice 33/65.0]
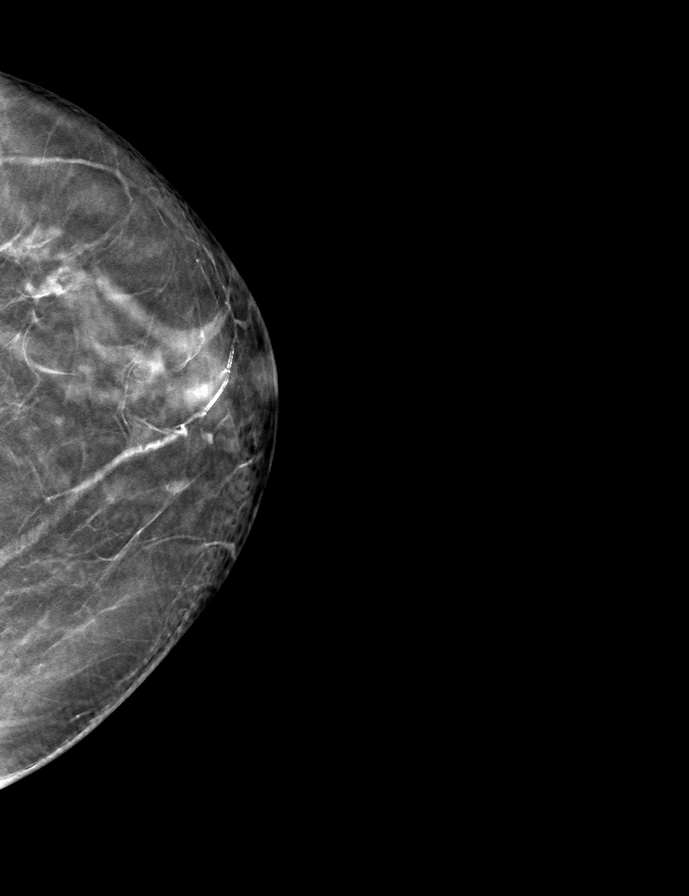

[R MLO tomo · tomo slice 41/80.0]
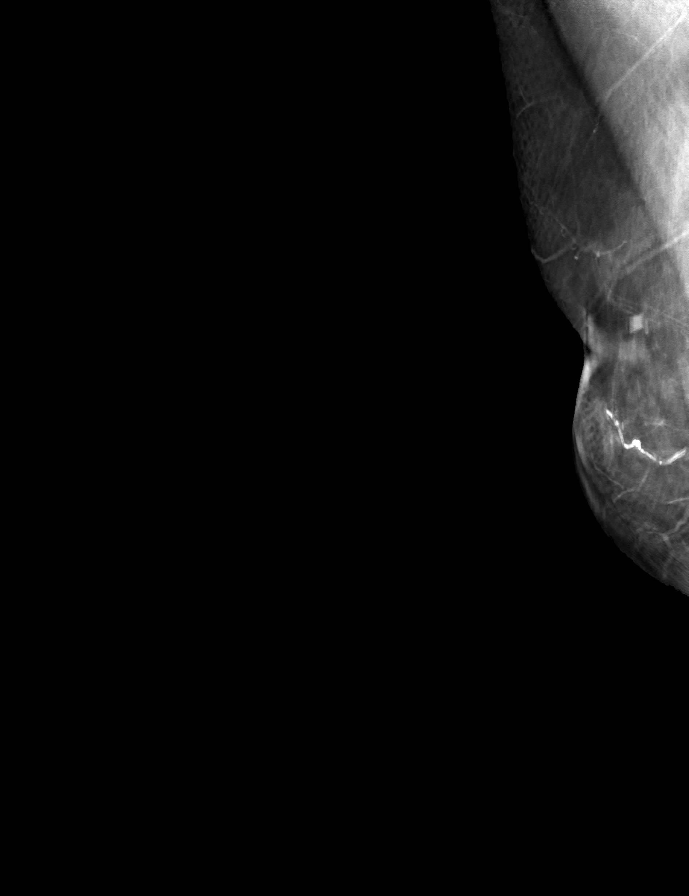

[L MLO tomo · tomo slice 35/69.0]
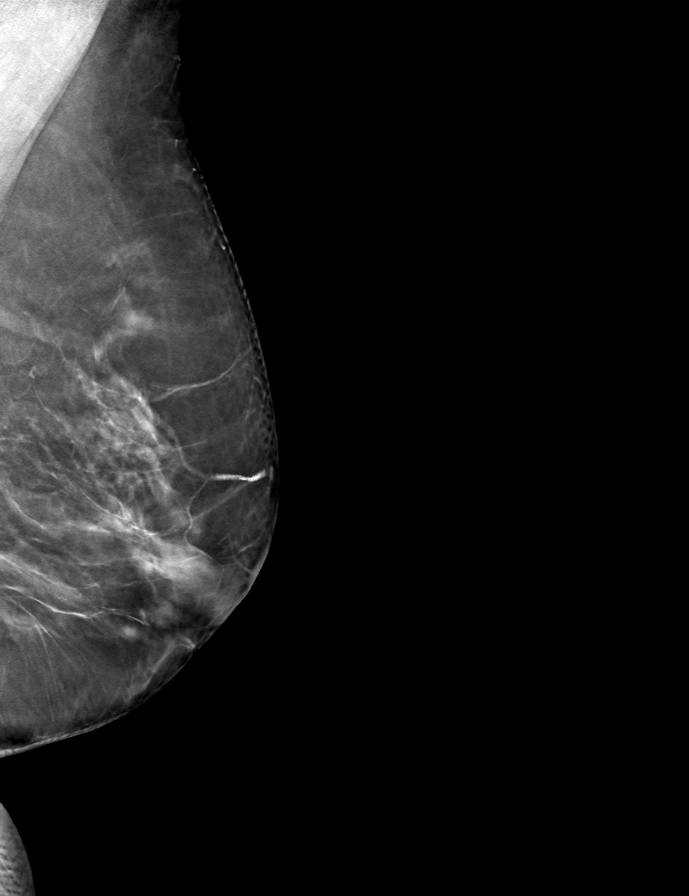

[9 of 24 positions shown; findings below may reference images not displayed]

ACR Breast Density Category b: There are scattered areas of
fibroglandular density.
FINDINGS: There are no findings suspicious for malignancy.
IMPRESSION: No mammographic evidence of malignancy. A result letter of this
screening mammogram will be mailed directly to the patient.

RECOMMENDATION:
Screening mammogram in one year. (Code:51-O-LD2)

BI-RADS CATEGORY  1: Negative.

## 2022-01-21 ENCOUNTER — Encounter: Payer: Self-pay | Admitting: Gastroenterology

## 2022-06-11 NOTE — Progress Notes (Unsigned)
HPI: Michele Conley is a 77 y.o. female, who is here today for chronic disease management.  Last seen on 12/11/21 No new problems since Michele Conley last visit.  Michele Conley has not been checking Michele Conley blood pressure at home. Michele Conley is currently taking amlodipine 10 mg daily and lisinopril 40 mg daily for HTN. Tolerating medications well. Negative for unusual or severe headache, visual changes, exertional chest pain, dyspnea,  focal weakness, or edema.  CKD III: Michele Conley has npt noted gross hematuria, foam in urine, or decreased urine output. Michele Conley e GFR has been in the high 50's and > 60%.  Lab Results  Component Value Date   CREATININE 0.74 12/11/2021   BUN 16 12/11/2021   NA 140 12/11/2021   K 4.6 12/11/2021   CL 106 12/11/2021   CO2 26 12/11/2021   Lab Results  Component Value Date   MICROALBUR 4.3 (H) 12/11/2021   MICROALBUR 5.9 (H) 10/19/2020   GERD: Michele Conley is taking omeprazole 20 mg daily for heartburn. Michele Conley has tried taking it every other day, if Michele Conley missed more days heartburn returns.  Negative for abdominal pain, nausea, or melena.  Today Michele Conley reports experiencing left shoulder and upper back pain for the past 10 days, which Michele Conley attributes to poor posture or sleeping wrong.  It is mild. Alleviated with massage. Michele Conley has not noticed any skin lesions in the area of pain. Michele Conley has not taken OTC medication. No hx of trauma.  Review of Systems  Constitutional:  Negative for activity change, appetite change, chills and fever.  HENT:  Negative for mouth sores, nosebleeds, sore throat and trouble swallowing.   Respiratory:  Negative for cough and wheezing.   Gastrointestinal:  Negative for vomiting.       Negative for changes in bowel habits.  Genitourinary:  Negative for decreased urine volume, difficulty urinating, dysuria and hematuria.  Skin:  Negative for rash.  Neurological:  Negative for syncope, facial asymmetry and numbness.  See other pertinent positives and negatives in  HPI.  Current Outpatient Medications on File Prior to Visit  Medication Sig Dispense Refill   amLODipine (NORVASC) 10 MG tablet TAKE 1 TABLET DAILY (DOSE HAS BEEN INCREASED) 90 tablet 3   aspirin 81 MG chewable tablet Chew 81 mg by mouth daily. EVERY OTHER DAY     atorvastatin (LIPITOR) 20 MG tablet TAKE 1 TABLET DAILY 90 tablet 3   Calcium Carbonate-Vitamin D 600-400 MG-UNIT tablet Take 1 tablet by mouth daily.       Coenzyme Q10 (CO Q 10 PO) Take 50 mg by mouth daily.     Cyanocobalamin (VITAMIN B 12 PO) Take 500 mcg by mouth daily.     hydrochlorothiazide (HYDRODIURIL) 25 MG tablet Take 1 tablet (25 mg total) by mouth daily. 90 tablet 1   hydrocortisone (PROCTOSOL HC) 2.5 % rectal cream Place 1 application rectally daily as needed for hemorrhoids or anal itching. 28 g 1   KRILL OIL OMEGA-3 PO Take 1 capsule by mouth daily.     lisinopril (ZESTRIL) 40 MG tablet TAKE 1 TABLET DAILY 90 tablet 3   omeprazole (PRILOSEC) 20 MG capsule Take 1 capsule (20 mg total) by mouth daily. 90 capsule 3   No current facility-administered medications on file prior to visit.   Past Medical History:  Diagnosis Date   Arthritis    hands   Breast cancer [facility]) 1996   Right Breast Cancer   BREAST CANCER, HX OF 10/31/2006   Cancer ([place]) 1996  right breast cancer    Chronic kidney disease    stage II pt thinks    Diverticulosis    GERD 10/31/2006   Gout    Hyperlipidemia    HYPERTENSION 10/31/2006   LBBB (left bundle branch block)    OSTEOPOROSIS 10/31/2006   osteopenia per pt not porosis    Personal history of radiation therapy 1996   Right Breast Cancer   Allergies  Allergen Reactions   Simvastatin     REACTION: nausea   Shrimp [Shellfish Allergy] Rash   Social History   Socioeconomic History   Marital status: Married    Spouse name: Not on file   Number of children: Not on file   Years of education: Not on file   Highest education level: Not on file  Occupational History   Not on file   Tobacco Use   Smoking status: Never   Smokeless tobacco: Never  Substance and Sexual Activity   Alcohol use: Yes    Comment: very rare   Drug use: No   Sexual activity: Not on file  Other Topics Concern   Not on file  Social History Narrative   Married 1969. 2 sons Nathaneil Canary in [place] and [place] in [place]. 5 grandkids, 1 greatgranddaughter, 1 great step grandson.       Retired from 45 years with [employer]      Hobbies: travel/sightseeing, time at [employer], [place] with friends. [employer] in [place] Determinants of Health   Financial Resource Strain: Low Risk  (07/24/2021)   Overall Financial Resource Strain (CARDIA)    Difficulty of Paying Living Expenses: Not hard at all  Food Insecurity: No Food Insecurity (07/24/2021)   Hunger Vital Sign    Worried About Running Out of Food in the Last Year: Never true    Ran Out of Food in the Last Year: Never true  Transportation Needs: No Transportation Needs (07/24/2021)   PRAPARE - Hydrologist (Medical): No    Lack of Transportation (Non-Medical): No  Physical Activity: Inactive (07/24/2021)   Exercise Vital Sign    Days of Exercise per Week: 0 days    Minutes of Exercise per Session: 0 min  Stress: No Stress Concern Present (07/24/2021)   [place]    Feeling of Stress : Not at all  Social Connections: Socially Isolated (07/24/2021)   Social Connection and Isolation Panel [NHANES]    Frequency of Communication with Friends and Family: More than three times a week    Frequency of Social Gatherings with Friends and Family: More than three times a week    Attends Religious Services: Never    Marine scientist or Organizations: No    Attends Archivist Meetings: Never    Marital Status: Widowed   Today's Vitals   06/12/22 0932  BP: 118/64  Pulse: 82  Resp: 16  Temp: 98 F (36.7 C)  TempSrc: Oral  SpO2: 97%   Weight: 131 lb 6 oz (59.6 kg)  Height: 5' 2"$  (1.575 m)   Body mass index is 24.03 kg/m.  Physical Exam Vitals and nursing note reviewed.  Constitutional:      General: Michele Conley is not in acute distress.    Appearance: Michele Conley is well-developed.  HENT:     Head: Normocephalic and atraumatic.     Mouth/Throat:     Mouth: Mucous membranes are moist.     Pharynx: Oropharynx  is clear.  Eyes:     Conjunctiva/sclera: Conjunctivae normal.  Cardiovascular:     Rate and Rhythm: Normal rate and regular rhythm.     Heart sounds: No murmur heard.    Comments: DP pulses palpable. Pulmonary:     Effort: Pulmonary effort is normal. No respiratory distress.     Breath sounds: Normal breath sounds.  Abdominal:     Palpations: Abdomen is soft. There is no hepatomegaly or mass.     Tenderness: There is no abdominal tenderness.  Musculoskeletal:     Cervical back: Spasms present. No tenderness.     Thoracic back: Spasms and tenderness present.       Back:  Lymphadenopathy:     Cervical: No cervical adenopathy.  Skin:    General: Skin is warm.     Findings: No erythema or rash.  Neurological:     General: No focal deficit present.     Mental Status: Michele Conley is alert and oriented to person, place, and time.     Cranial Nerves: No cranial nerve deficit.     Gait: Gait normal.  Psychiatric:        Mood and Affect: Mood and affect normal.   ASSESSMENT AND PLAN:  [name] was seen today for medical management of chronic issues.  Diagnoses and all orders for this visit: Lab Results  Component Value Date   CREATININE 0.86 06/12/2022   BUN 13 06/12/2022   NA 142 06/12/2022   K 4.0 06/12/2022   CL 105 06/12/2022   CO2 26 06/12/2022   Stage 3a chronic kidney disease ([place]) Assessment & Plan: We discussed Dx criteria, prognosis,and treatment. Michele Conley e GFR has been > 60 for a few months, occasional eGFR 50's in the past 1-2 years. Continue low salt diet,adequate hydration and BP controlled, as well  as NSAID's avoidance.   Gastroesophageal reflux disease without esophagitis Assessment & Plan: Problem is well controlled. Continue Omeprazole 20 mg q 1-2 days.  Michele Conley has tried to stop medication but symptoms reoccur. GERD precautions also recommended.  Orders: -     Omeprazole; Take 1 capsule (20 mg total) by mouth daily.  Dispense: 90 capsule; Refill: 3  Upper back pain Assessment & Plan: Muscular pain. Differential Dx discussed. Local massage and asper cream or icy hot on affected area recommended. Tylenol 500 gm 3-4 times per day and stretching exercises. I do not think imaging is needed at this time. PT can be considered if pain does not resolved in a couple weeks.   Essential hypertension Assessment & Plan: BP adequately controlled. Continue  Lisinopril 40 mg daily,Amlodipine 10 mg daily,and HCTZ 25 mg daily. DASH/low salt diet to continue. Recommend monitoring BP at home.  Orders: -     Basic metabolic panel; Future  Return in about 6 months (around 12/20/2022) for chronic problems.  Vesta Wheeland G. [place], MD  [facility]. [place] office.

## 2022-06-12 ENCOUNTER — Encounter: Payer: Self-pay | Admitting: Family Medicine

## 2022-06-12 ENCOUNTER — Ambulatory Visit (INDEPENDENT_AMBULATORY_CARE_PROVIDER_SITE_OTHER): Payer: Medicare Other | Admitting: Family Medicine

## 2022-06-12 VITALS — BP 118/64 | HR 82 | Temp 98.0°F | Resp 16 | Ht 62.0 in | Wt 131.4 lb

## 2022-06-12 DIAGNOSIS — I10 Essential (primary) hypertension: Secondary | ICD-10-CM | POA: Diagnosis not present

## 2022-06-12 DIAGNOSIS — N1831 Chronic kidney disease, stage 3a: Secondary | ICD-10-CM

## 2022-06-12 DIAGNOSIS — M549 Dorsalgia, unspecified: Secondary | ICD-10-CM | POA: Insufficient documentation

## 2022-06-12 DIAGNOSIS — K219 Gastro-esophageal reflux disease without esophagitis: Secondary | ICD-10-CM

## 2022-06-12 DIAGNOSIS — E785 Hyperlipidemia, unspecified: Secondary | ICD-10-CM

## 2022-06-12 MED ORDER — OMEPRAZOLE 20 MG PO CPDR: 20.0000 mg | DELAYED_RELEASE_CAPSULE | Freq: Every day | ORAL | 3 refills | Status: DC

## 2022-06-12 NOTE — Assessment & Plan Note (Signed)
We discussed Dx criteria, prognosis,and treatment. Her e GFR has been > 60 for a few months, occasional eGFR 50's in the past 1-2 years. Continue low salt diet,adequate hydration and BP controlled, as well as NSAID's avoidance.

## 2022-06-12 NOTE — Assessment & Plan Note (Signed)
Problem is well controlled. Continue Omeprazole 20 mg q 1-2 days.  She has tried to stop medication but symptoms reoccur. GERD precautions also recommended.

## 2022-06-12 NOTE — Assessment & Plan Note (Signed)
BP adequately controlled. Continue  Lisinopril 40 mg daily,Amlodipine 10 mg daily,and HCTZ 25 mg daily. DASH/low salt diet to continue. Recommend monitoring BP at home.

## 2022-06-12 NOTE — Patient Instructions (Signed)
A few things to remember from today's visit:  Essential hypertension - Plan: Basic metabolic panel  Gastroesophageal reflux disease without esophagitis - Plan: omeprazole (PRILOSEC) 20 MG capsule  For left upper back topical icy hot or asper cream and massage. Stretching and improving posture will help. Physical therapy can be considered if not better.  If you need refills for medications you take chronically, please call your pharmacy. Do not use My Chart to request refills or for acute issues that need immediate attention. If you send a my chart message, it may take a few days to be addressed, specially if I am not in the office.  Please be sure medication list is accurate. If a new problem present, please set up appointment sooner than planned today.

## 2022-06-13 LAB — BASIC METABOLIC PANEL
BUN: 13 mg/dL (ref 6–23)
CO2: 26 mEq/L (ref 19–32)
Calcium: 10.2 mg/dL (ref 8.4–10.5)
Chloride: 105 mEq/L (ref 96–112)
Creatinine, Ser: 0.86 mg/dL (ref 0.40–1.20)
GFR: 65.33 mL/min (ref >60.00)
Glucose, Bld: 96 mg/dL (ref 70–99)
Potassium: 4 mEq/L (ref 3.5–5.1)
Sodium: 142 mEq/L (ref 135–145)

## 2022-06-13 NOTE — Assessment & Plan Note (Signed)
Muscular pain. Differential Dx discussed. Local massage and asper cream or icy hot on affected area recommended. Tylenol 500 gm 3-4 times per day and stretching exercises. I do not think imaging is needed at this time. PT can be considered if pain does not resolved in a couple weeks.

## 2022-08-13 ENCOUNTER — Telehealth: Payer: Self-pay | Admitting: Family Medicine

## 2022-08-13 NOTE — Telephone Encounter (Signed)
Contacted Michele Conley to schedule their annual wellness visit. Patient declined to schedule AWV at this time.  Do not call  [name] AWV direct phone # [phone]   Spoke with patient to schedule AWV.  Patient declined stating she was not interested did not think it was necessary

## 2022-11-20 ENCOUNTER — Encounter (INDEPENDENT_AMBULATORY_CARE_PROVIDER_SITE_OTHER): Payer: Self-pay

## 2022-12-11 ENCOUNTER — Ambulatory Visit: Payer: Medicare Other | Admitting: Family Medicine

## 2022-12-19 DIAGNOSIS — H25043 Posterior subcapsular polar age-related cataract, bilateral: Secondary | ICD-10-CM | POA: Diagnosis not present

## 2022-12-19 DIAGNOSIS — H2511 Age-related nuclear cataract, right eye: Secondary | ICD-10-CM | POA: Diagnosis not present

## 2022-12-19 DIAGNOSIS — H18413 Arcus senilis, bilateral: Secondary | ICD-10-CM | POA: Diagnosis not present

## 2022-12-19 DIAGNOSIS — H25013 Cortical age-related cataract, bilateral: Secondary | ICD-10-CM | POA: Diagnosis not present

## 2022-12-19 DIAGNOSIS — H2513 Age-related nuclear cataract, bilateral: Secondary | ICD-10-CM | POA: Diagnosis not present

## 2023-01-10 ENCOUNTER — Ambulatory Visit (INDEPENDENT_AMBULATORY_CARE_PROVIDER_SITE_OTHER): Payer: Medicare Other | Admitting: Family Medicine

## 2023-01-10 ENCOUNTER — Encounter: Payer: Self-pay | Admitting: Family Medicine

## 2023-01-10 VITALS — BP 136/76 | HR 65 | Temp 98.4°F | Resp 16 | Ht 62.0 in | Wt 134.1 lb

## 2023-01-10 DIAGNOSIS — K219 Gastro-esophageal reflux disease without esophagitis: Secondary | ICD-10-CM

## 2023-01-10 DIAGNOSIS — E785 Hyperlipidemia, unspecified: Secondary | ICD-10-CM

## 2023-01-10 DIAGNOSIS — N1831 Chronic kidney disease, stage 3a: Secondary | ICD-10-CM

## 2023-01-10 DIAGNOSIS — E559 Vitamin D deficiency, unspecified: Secondary | ICD-10-CM

## 2023-01-10 DIAGNOSIS — I10 Essential (primary) hypertension: Secondary | ICD-10-CM

## 2023-01-10 LAB — COMPREHENSIVE METABOLIC PANEL
ALT: 17 U/L (ref 0–35)
AST: 18 U/L (ref 0–37)
Albumin: 4.2 g/dL (ref 3.5–5.2)
Alkaline Phosphatase: 97 U/L (ref 39–117)
BUN: 14 mg/dL (ref 6–23)
CO2: 26 mEq/L (ref 19–32)
Calcium: 9.9 mg/dL (ref 8.4–10.5)
Chloride: 103 mEq/L (ref 96–112)
Creatinine, Ser: 0.81 mg/dL (ref 0.40–1.20)
GFR: 69.92 mL/min (ref >60.00)
Glucose, Bld: 88 mg/dL (ref 70–99)
Potassium: 4.2 mEq/L (ref 3.5–5.1)
Sodium: 140 mEq/L (ref 135–145)
Total Bilirubin: 0.6 mg/dL (ref 0.2–1.2)
Total Protein: 7.2 g/dL (ref 6.0–8.3)

## 2023-01-10 LAB — LIPID PANEL
Cholesterol: 282 mg/dL — ABNORMAL HIGH (ref 0–200)
HDL: 79.3 mg/dL (ref >39.00)
LDL Cholesterol: 166 mg/dL — ABNORMAL HIGH (ref 0–99)
NonHDL: 202.45
Total CHOL/HDL Ratio: 4
Triglycerides: 183 mg/dL — ABNORMAL HIGH (ref 0.0–149.0)
VLDL: 36.6 mg/dL (ref 0.0–40.0)

## 2023-01-10 LAB — VITAMIN D 25 HYDROXY (VIT D DEFICIENCY, FRACTURES): VITD: 27.47 ng/mL — ABNORMAL LOW (ref 30.00–100.00)

## 2023-01-10 MED ORDER — HYDROCHLOROTHIAZIDE 25 MG PO TABS: 25.0000 mg | ORAL_TABLET | Freq: Every day | ORAL | 3 refills | Status: AC

## 2023-01-10 MED ORDER — OMEPRAZOLE 20 MG PO CPDR: 20.0000 mg | DELAYED_RELEASE_CAPSULE | Freq: Every day | ORAL | 3 refills | Status: AC

## 2023-01-10 MED ORDER — AMLODIPINE BESYLATE 10 MG PO TABS: 10.0000 mg | ORAL_TABLET | Freq: Every day | ORAL | 3 refills | Status: AC

## 2023-01-10 MED ORDER — LISINOPRIL 40 MG PO TABS: 40.0000 mg | ORAL_TABLET | Freq: Every day | ORAL | 3 refills | Status: AC

## 2023-01-10 MED ORDER — ATORVASTATIN CALCIUM 20 MG PO TABS: ORAL_TABLET | ORAL | 3 refills | Status: AC

## 2023-01-10 NOTE — Progress Notes (Signed)
HPI: Michele Conley is a 77 y.o. female with a PMHx significant for HTN, HLD, GERD, vitamin D deficiency, and CKD III, who is here today for chronic disease management.  Last seen on 06/12/2022.   She reports no new problems.  Hypertension: She is taking amlodipine 10 mg daily, lisinopril 40 mg daily, and hydrochlorothiazide 25 mg daily. BP readings at home: She has been occasionally checking at home. Sometimes her systolic reading is in the 140s.  Reports no side effects from medications. Negative for unusual or severe headache, exertional chest pain, dyspnea, focal weakness, or edema.  Lab Results  Component Value Date   CREATININE 0.86 06/12/2022   BUN 13 06/12/2022   NA 142 06/12/2022   K 4.0 06/12/2022   CL 105 06/12/2022   CO2 26 06/12/2022   HLD:  She is taking atorvastatin 20 mg daily.  Side effects: none Lab Results  Component Value Date   CHOL 196 12/11/2021   HDL 79.20 12/11/2021   LDLCALC 88 12/11/2021   LDLDIRECT 122.0 10/21/2016   TRIG 146.0 12/11/2021   CHOLHDL 2 12/11/2021   CKD III: She has no gross hematuria, foam in urine, or decreased urine output. Her e GFR has been in the 60's most of the time. In 2016-2017  it was 53,55,and 58. Cr. 0.9-1.1.  GERD: She is taking omeprazole 20 mg every couple of days.  She reports that after the second or third day without it, she starts to experience heartburn again.   Vit D deficiency:She is taking her vitamin D supplements, not sure about dose..   She also reports she is scheduled for cataract surgery soon.   Review of Systems  Constitutional:  Negative for activity change, appetite change and fever.  HENT:  Negative for mouth sores and nosebleeds.   Respiratory:  Negative for cough and wheezing.   Gastrointestinal:  Negative for abdominal pain, nausea and vomiting.       Negative for changes in bowel habits.  Genitourinary:  Negative for dysuria.  Skin:  Negative for rash.  Neurological:  Negative  for syncope and facial asymmetry.  Psychiatric/Behavioral:  Negative for confusion.   See other pertinent positives and negatives in HPI.  Current Outpatient Medications on File Prior to Visit  Medication Sig Dispense Refill   amLODipine (NORVASC) 10 MG tablet TAKE 1 TABLET DAILY (DOSE HAS BEEN INCREASED) 90 tablet 3   aspirin 81 MG chewable tablet Chew 81 mg by mouth daily. EVERY OTHER DAY     atorvastatin (LIPITOR) 20 MG tablet TAKE 1 TABLET DAILY 90 tablet 3   Calcium Carbonate-Vitamin D 600-400 MG-UNIT tablet Take 1 tablet by mouth daily.       Coenzyme Q10 (CO Q 10 PO) Take 50 mg by mouth daily.     Cyanocobalamin (VITAMIN B 12 PO) Take 500 mcg by mouth daily.     hydrochlorothiazide (HYDRODIURIL) 25 MG tablet Take 1 tablet (25 mg total) by mouth daily. 90 tablet 1   hydrocortisone (PROCTOSOL HC) 2.5 % rectal cream Place 1 application rectally daily as needed for hemorrhoids or anal itching. 28 g 1   KRILL OIL OMEGA-3 PO Take 1 capsule by mouth daily.     lisinopril (ZESTRIL) 40 MG tablet TAKE 1 TABLET DAILY 90 tablet 3   omeprazole (PRILOSEC) 20 MG capsule Take 1 capsule (20 mg total) by mouth daily. 90 capsule 3   No current facility-administered medications on file prior to visit.   Past Medical History:  Diagnosis  Date   Arthritis    hands   Breast cancer [facility]) 1996   Right Breast Cancer   BREAST CANCER, HX OF 10/31/2006   Cancer ([facility]) 1996   right breast cancer    Chronic kidney disease    stage II pt thinks    Diverticulosis    GERD 10/31/2006   Gout    Hyperlipidemia    HYPERTENSION 10/31/2006   LBBB (left bundle branch block)    OSTEOPOROSIS 10/31/2006   osteopenia per pt not porosis    Personal history of radiation therapy 1996   Right Breast Cancer   Allergies  Allergen Reactions   Simvastatin     REACTION: nausea   Shrimp [Shellfish Allergy] Rash    Social History   Socioeconomic History   Marital status: Married    Spouse name: Not on file   Number  of children: Not on file   Years of education: Not on file   Highest education level: Not on file  Occupational History   Not on file  Tobacco Use   Smoking status: Never   Smokeless tobacco: Never  Substance and Sexual Activity   Alcohol use: Yes    Comment: very rare   Drug use: No   Sexual activity: Not on file  Other Topics Concern   Not on file  Social History Narrative   Married 1969. 2 sons Riley Lam in [place] and [place] in [place]. 5 grandkids, 1 greatgranddaughter, 1 great step grandson.       Retired from 20 years with [employer]      Hobbies: travel/sightseeing, time at [employer], [place] with friends. [employer] in [place]   Social Determinants of Health   Financial Resource Strain: Low Risk  (07/24/2021)   Overall Financial Resource Strain (CARDIA)    Difficulty of Paying Living Expenses: Not hard at all  Food Insecurity: No Food Insecurity (07/24/2021)   Hunger Vital Sign    Worried About Running Out of Food in the Last Year: Never true    Ran Out of Food in the Last Year: Never true  Transportation Needs: No Transportation Needs (07/24/2021)   PRAPARE - Administrator, Civil Service (Medical): No    Lack of Transportation (Non-Medical): No  Physical Activity: Inactive (07/24/2021)   Exercise Vital Sign    Days of Exercise per Week: 0 days    Minutes of Exercise per Session: 0 min  Stress: No Stress Concern Present (07/24/2021)   [employer] of Occupational Health - Occupational Stress Questionnaire    Feeling of Stress : Not at all  Social Connections: Socially Isolated (07/24/2021)   Social Connection and Isolation Panel [NHANES]    Frequency of Communication with Friends and Family: More than three times a week    Frequency of Social Gatherings with Friends and Family: More than three times a week    Attends Religious Services: Never    Database administrator or Organizations: No    Attends Banker Meetings: Never    Marital  Status: Widowed   Today's Vitals   01/10/23 1133  BP: 136/76  Pulse: 65  Resp: 16  Temp: 98.4 F (36.9 C)  TempSrc: Oral  SpO2: 95%  Weight: 134 lb 2 oz (60.8 kg)  Height: 5\' 2"  (1.575 m)   Body mass index is 24.53 kg/m.  Physical Exam Vitals and nursing note reviewed.  Constitutional:      General: She is not in acute distress.  Appearance: She is well-developed.  HENT:     Head: Normocephalic and atraumatic.     Mouth/Throat:     Mouth: Mucous membranes are moist.     Pharynx: Oropharynx is clear.  Eyes:     Conjunctiva/sclera: Conjunctivae normal.  Cardiovascular:     Rate and Rhythm: Normal rate and regular rhythm.     Pulses:          Dorsalis pedis pulses are 2+ on the right side and 2+ on the left side.     Heart sounds: No murmur heard. Pulmonary:     Effort: Pulmonary effort is normal. No respiratory distress.     Breath sounds: Normal breath sounds.  Abdominal:     Palpations: Abdomen is soft. There is no hepatomegaly or mass.     Tenderness: There is no abdominal tenderness.  Skin:    General: Skin is warm.     Findings: No erythema or rash.  Neurological:     General: No focal deficit present.     Mental Status: She is alert and oriented to person, place, and time.     Cranial Nerves: No cranial nerve deficit.     Gait: Gait normal.  Psychiatric:        Mood and Affect: Mood and affect normal.   ASSESSMENT AND PLAN:  Ms. Hellums was seen today for chronic disease management.   Lab Results  Component Value Date   NA 140 01/10/2023   CL 103 01/10/2023   K 4.2 01/10/2023   CO2 26 01/10/2023   BUN 14 01/10/2023   CREATININE 0.81 01/10/2023   GFR 69.92 01/10/2023   CALCIUM 9.9 01/10/2023   ALBUMIN 4.2 01/10/2023   GLUCOSE 88 01/10/2023   Lab Results  Component Value Date   ALT 17 01/10/2023   AST 18 01/10/2023   ALKPHOS 97 01/10/2023   BILITOT 0.6 01/10/2023   Lab Results  Component Value Date   CHOL 282 (H) 01/10/2023   HDL 79.30  01/10/2023   LDLCALC 166 (H) 01/10/2023   LDLDIRECT 122.0 10/21/2016   TRIG 183.0 (H) 01/10/2023   CHOLHDL 4 01/10/2023   Stage 3a chronic kidney disease (HCC) Assessment & Plan: Renal function has improved through the years. Continue low salt diet,adequate hydration and BP controlled. Avoid NSAID's and continue continue Lisinopril.   Essential hypertension Assessment & Plan: BP adequately controlled. Continue  Lisinopril 40 mg daily,Amlodipine 10 mg daily,and HCTZ 25 mg daily as well as low salt diet. Recommend monitoring BP at home 1-2 times per months. Eye exam is current.  Orders: -     amLODIPine Besylate; Take 1 tablet (10 mg total) by mouth daily.  Dispense: 90 tablet; Refill: 3 -     hydroCHLOROthiazide; Take 1 tablet (25 mg total) by mouth daily.  Dispense: 90 tablet; Refill: 3 -     Lisinopril; Take 1 tablet (40 mg total) by mouth daily.  Dispense: 90 tablet; Refill: 3 -     Comprehensive metabolic panel; Future  Hyperlipidemia, unspecified hyperlipidemia type Assessment & Plan: Continue Atorvastatin 20 mg daily. Last LDL 88 and non HDL 117 in 0/9811. Further recommendations will be given according to FLP result.  Orders: -     Atorvastatin Calcium; TAKE 1 TABLET DAILY  Dispense: 90 tablet; Refill: 3 -     Comprehensive metabolic panel; Future -     Lipid panel; Future  Gastroesophageal reflux disease without esophagitis Assessment & Plan: Problem is well controlled. Continue Omeprazole 20 mg every  1-2 days and GERD precautions.  Orders: -     Omeprazole; Take 1 capsule (20 mg total) by mouth daily.  Dispense: 90 capsule; Refill: 3  Vitamin D deficiency, unspecified Assessment & Plan: Continue current dose of vit D supplementation. Further recommendations according to 25 [place] vit D result.  Orders: -     VITAMIN D 25 Hydroxy (Vit-D Deficiency, Fractures); Future     Return in about 6 months (around 07/10/2023) for chronic problems.   I, Suanne Marker, acting as a scribe for Yohan Samons [place], MD., have documented all relevant documentation on the behalf of Rosaleen Mazer [place], MD, as directed by  Koen Antilla [place], MD while in the presence of Aldene Hendon [place], MD.   I, Suanne Marker, have reviewed all documentation for this visit. The documentation on 01/10/23 for the exam, diagnosis, procedures, and orders are all accurate and complete.  Marabelle Cushman G. [place], MD  [facility]. [place] office.

## 2023-01-10 NOTE — Patient Instructions (Addendum)
A few things to remember from today's visit:  Stage 3a chronic kidney disease (HCC)  Essential hypertension - Plan: amLODipine (NORVASC) 10 MG tablet, hydrochlorothiazide (HYDRODIURIL) 25 MG tablet, lisinopril (ZESTRIL) 40 MG tablet, Comprehensive metabolic panel  Hyperlipidemia, unspecified hyperlipidemia type - Plan: atorvastatin (LIPITOR) 20 MG tablet, Comprehensive metabolic panel, Lipid panel  Gastroesophageal reflux disease without esophagitis - Plan: omeprazole (PRILOSEC) 20 MG capsule  Vitamin D deficiency, unspecified - Plan: VITAMIN D 25 Hydroxy (Vit-D Deficiency, Fractures)  No changes today. Monitor blood pressure at home 1-2 per months, goal is under 140/90, ideally 130/80 or less if well tolerated.  If you need refills for medications you take chronically, please call your pharmacy. Do not use My Chart to request refills or for acute issues that need immediate attention. If you send a my chart message, it may take a few days to be addressed, specially if I am not in the office.  Please be sure medication list is accurate. If a new problem present, please set up appointment sooner than planned today.

## 2023-01-11 ENCOUNTER — Encounter: Payer: Self-pay | Admitting: Family Medicine

## 2023-01-11 NOTE — Assessment & Plan Note (Signed)
Continue current dose of vit D supplementation. Further recommendations according to 25 [place] vit D result. 

## 2023-01-11 NOTE — Assessment & Plan Note (Signed)
Continue Atorvastatin 20 mg daily. Last LDL 88 and non HDL 117 in 10/2534. Further recommendations will be given according to FLP result.

## 2023-01-11 NOTE — Assessment & Plan Note (Signed)
Renal function has improved through the years. Continue low salt diet,adequate hydration and BP controlled. Avoid NSAID's and continue continue Lisinopril.

## 2023-01-11 NOTE — Assessment & Plan Note (Signed)
BP adequately controlled. Continue  Lisinopril 40 mg daily,Amlodipine 10 mg daily,and HCTZ 25 mg daily as well as low salt diet. Recommend monitoring BP at home 1-2 times per months. Eye exam is current.

## 2023-01-11 NOTE — Assessment & Plan Note (Signed)
Problem is well controlled. Continue Omeprazole 20 mg every 1-2 days and GERD precautions.

## 2023-01-22 DIAGNOSIS — H2511 Age-related nuclear cataract, right eye: Secondary | ICD-10-CM | POA: Diagnosis not present

## 2023-01-23 DIAGNOSIS — H2512 Age-related nuclear cataract, left eye: Secondary | ICD-10-CM | POA: Diagnosis not present

## 2023-02-05 DIAGNOSIS — H2512 Age-related nuclear cataract, left eye: Secondary | ICD-10-CM | POA: Diagnosis not present

## 2023-07-11 ENCOUNTER — Encounter: Payer: Self-pay | Admitting: Family Medicine

## 2023-07-11 ENCOUNTER — Ambulatory Visit

## 2023-07-11 ENCOUNTER — Ambulatory Visit (INDEPENDENT_AMBULATORY_CARE_PROVIDER_SITE_OTHER): Payer: Medicare Other | Admitting: Family Medicine

## 2023-07-11 VITALS — BP 180/100 | HR 73 | Temp 97.7°F | Resp 16 | Ht 62.0 in | Wt 137.2 lb

## 2023-07-11 DIAGNOSIS — M545 Low back pain, unspecified: Secondary | ICD-10-CM

## 2023-07-11 DIAGNOSIS — E559 Vitamin D deficiency, unspecified: Secondary | ICD-10-CM | POA: Diagnosis not present

## 2023-07-11 DIAGNOSIS — I10 Essential (primary) hypertension: Secondary | ICD-10-CM

## 2023-07-11 DIAGNOSIS — M47816 Spondylosis without myelopathy or radiculopathy, lumbar region: Secondary | ICD-10-CM | POA: Diagnosis not present

## 2023-07-11 DIAGNOSIS — E785 Hyperlipidemia, unspecified: Secondary | ICD-10-CM

## 2023-07-11 DIAGNOSIS — R208 Other disturbances of skin sensation: Secondary | ICD-10-CM

## 2023-07-11 DIAGNOSIS — N1831 Chronic kidney disease, stage 3a: Secondary | ICD-10-CM

## 2023-07-11 LAB — CBC
HCT: 45.1 % (ref 36.0–46.0)
Hemoglobin: 14.9 g/dL (ref 12.0–15.0)
MCHC: 33 g/dL (ref 30.0–36.0)
MCV: 91.2 fl (ref 78.0–100.0)
Platelets: 301 10*3/uL (ref 150.0–400.0)
RBC: 4.94 Mil/uL (ref 3.87–5.11)
RDW: 13.7 % (ref 11.5–15.5)
WBC: 8.5 10*3/uL (ref 4.0–10.5)

## 2023-07-11 LAB — MICROALBUMIN / CREATININE URINE RATIO
Creatinine,U: 45.8 mg/dL
Microalb Creat Ratio: 186.9 mg/g — ABNORMAL HIGH (ref 0.0–30.0)
Microalb, Ur: 8.6 mg/dL — ABNORMAL HIGH (ref 0.0–1.9)

## 2023-07-11 LAB — BASIC METABOLIC PANEL
BUN: 12 mg/dL (ref 6–23)
CO2: 27 meq/L (ref 19–32)
Calcium: 9.7 mg/dL (ref 8.4–10.5)
Chloride: 102 meq/L (ref 96–112)
Creatinine, Ser: 0.86 mg/dL (ref 0.40–1.20)
GFR: 64.84 mL/min (ref >60.00)
Glucose, Bld: 96 mg/dL (ref 70–99)
Potassium: 4 meq/L (ref 3.5–5.1)
Sodium: 140 meq/L (ref 135–145)

## 2023-07-11 LAB — VITAMIN D 25 HYDROXY (VIT D DEFICIENCY, FRACTURES): VITD: 30.61 ng/mL (ref 30.00–100.00)

## 2023-07-11 LAB — VITAMIN B12: Vitamin B-12: 327 pg/mL (ref 211–911)

## 2023-07-11 LAB — TSH: TSH: 2.02 u[IU]/mL (ref 0.35–5.50)

## 2023-07-11 NOTE — Assessment & Plan Note (Signed)
 Renal function stable otherwise, for the past few years e GFR has been > 60. Continue low salt diet,adequate hydration, and avoidance of NSAIDs. Stressed the importance of good BP controlled and avoidance of NSAID's.

## 2023-07-11 NOTE — Patient Instructions (Addendum)
 A few things to remember from today's visit:  Hyperlipidemia, unspecified hyperlipidemia type  Vitamin D deficiency, unspecified - Plan: VITAMIN D 25 Hydroxy (Vit-D Deficiency, Fractures)  Stage 3a chronic kidney disease (HCC) - Plan: Basic metabolic panel, Microalbumin / creatinine urine ratio, VITAMIN D 25 Hydroxy (Vit-D Deficiency, Fractures)  Essential hypertension - Plan: TSH  Bilateral low back pain, unspecified chronicity, unspecified whether sciatica present - Plan: DG Lumbar Spine Complete, CBC  Burning sensation of skin - Plan: TSH, Vitamin B12  You can try topical icy hot or asper cream for back pain. Continue Tylenol 500 mg 2-3 times per day. Take blood pressure medications daily. Monitor blood pressure and let me know about readings in 2 weeks.  If you need refills for medications you take chronically, please call your pharmacy. Do not use My Chart to request refills or for acute issues that need immediate attention. If you send a my chart message, it may take a few days to be addressed, specially if I am not in the office.  Please be sure medication list is accurate. If a new problem present, please set up appointment sooner than planned today.

## 2023-07-11 NOTE — Assessment & Plan Note (Addendum)
 She is not monitoring BP at home. She acknowledges she has not been taking her antihypertensive medications as instructed, she is missing days. For now she will continue amlodipine 10 mg daily, HCTZ 25 mg daily, lisinopril 40 mg daily. We discussed some complications of elevated BP. Instructed to monitor BP daily and to let me know about readings in 14 days. Instructed about warning signs. Follow-up in 2 months.

## 2023-07-11 NOTE — Assessment & Plan Note (Signed)
She has not been consistent with taking vitamin D supplementation. Further recommendation will be given according to 25 OH vitamin D result. 

## 2023-07-11 NOTE — Assessment & Plan Note (Signed)
 She has not been consistent with taking atorvastatin 20 mg, so prefers to hold on fasting lipid panel. Educated about the importance of taking her meds daily as instructed.

## 2023-07-11 NOTE — Progress Notes (Signed)
 HPI: [name] is a 78 y.o. female with a PMHx significant for HTN, HLD, GERD, vitamin D deficiency, and CKD III, who is here today for chronic disease management.  Last seen on 01/10/2023  Hypertension:  Medications: Currently on amlodipine 10 mg, hydrochlorothiazide 25 mg, and lisinopril 40 mg. She has not been taking them consistently right now.  BP readings at home: She has not been checking her BP regularly at home.  Side effects: none Negative for unusual or severe headache, visual changes, exertional chest pain, dyspnea, palpitations, focal weakness, or edema. Lab Results  Component Value Date   NA 140 01/10/2023   CL 103 01/10/2023   K 4.2 01/10/2023   CO2 26 01/10/2023   BUN 14 01/10/2023   CREATININE 0.81 01/10/2023   GFR 69.92 01/10/2023   CALCIUM 9.9 01/10/2023   ALBUMIN 4.2 01/10/2023   GLUCOSE 88 01/10/2023   Hyperlipidemia: Currently on atorvastatin 20 mg. She says she has not been consistent in taking the medication.  Side effects from medication: none Lab Results  Component Value Date   CHOL 282 (H) 01/10/2023   HDL 79.30 01/10/2023   LDLCALC 166 (H) 01/10/2023   LDLDIRECT 122.0 10/21/2016   TRIG 183.0 (H) 01/10/2023   CHOLHDL 4 01/10/2023   Vitamin D deficiency:  She occasionally takes vitamin D supplementation.  Lab Results  Component Value Date   VD25OH 27.47 (L) 01/10/2023   Concerns today:   She complains of intermittent lower back pain radiating down into her lateral aspect of hips for about the last month. She noticed the pain first as a sharp pain one time when she was standing up after bending over.  She rates the pain as a 6/10, and says it has given her some difficulty sleeping.  She has been taking tylenol for the pain.  Pertinent negatives include fever,LE's numbness, tingling, saddle anesthesia,bowel/bladder dysfunction, gross hematuria, or dysuria.  She also complains of a couple weeks of burning sensation under her  fingernails, she has this problem on both hands but it is worse in the right hand. She wonders if it is related to OA. No focal weakness, cyanosis,or ulcers.  Review of Systems  Constitutional:  Negative for appetite change and unexpected weight change.  HENT:  Negative for mouth sores and sore throat.   Respiratory:  Negative for cough and wheezing.   Gastrointestinal:  Negative for abdominal pain, nausea and vomiting.  Endocrine: Negative for cold intolerance and heat intolerance.  Genitourinary:  Negative for decreased urine volume and dysuria.  Musculoskeletal:  Positive for arthralgias.  Skin:  Negative for rash.  Neurological:  Negative for syncope and facial asymmetry.  Psychiatric/Behavioral:  Negative for confusion. The patient is nervous/anxious.   See other pertinent positives and negatives in HPI.  Current Outpatient Medications on File Prior to Visit  Medication Sig Dispense Refill   amLODipine (NORVASC) 10 MG tablet Take 1 tablet (10 mg total) by mouth daily. 90 tablet 3   aspirin 81 MG chewable tablet Chew 81 mg by mouth daily. EVERY OTHER DAY     atorvastatin (LIPITOR) 20 MG tablet TAKE 1 TABLET DAILY 90 tablet 3   Calcium Carbonate-Vitamin D 600-400 MG-UNIT tablet Take 1 tablet by mouth daily.       Coenzyme Q10 (CO Q 10 PO) Take 50 mg by mouth daily.     Cyanocobalamin (VITAMIN B 12 PO) Take 500 mcg by mouth daily.     hydrochlorothiazide (HYDRODIURIL) 25 MG tablet Take 1 tablet (  25 mg total) by mouth daily. 90 tablet 3   hydrocortisone (PROCTOSOL HC) 2.5 % rectal cream Place 1 application rectally daily as needed for hemorrhoids or anal itching. 28 g 1   KRILL OIL OMEGA-3 PO Take 1 capsule by mouth daily.     lisinopril (ZESTRIL) 40 MG tablet Take 1 tablet (40 mg total) by mouth daily. 90 tablet 3   omeprazole (PRILOSEC) 20 MG capsule Take 1 capsule (20 mg total) by mouth daily. 90 capsule 3   No current facility-administered medications on file prior to visit.    Past Medical History:  Diagnosis Date   Arthritis    hands   Breast cancer (HCC) 1996   Right Breast Cancer   BREAST CANCER, HX OF 10/31/2006   Cancer (HCC) 1996   right breast cancer    Chronic kidney disease    stage II pt thinks    Diverticulosis    GERD 10/31/2006   Gout    Hyperlipidemia    HYPERTENSION 10/31/2006   LBBB (left bundle branch block)    OSTEOPOROSIS 10/31/2006   osteopenia per pt not porosis    Personal history of radiation therapy 1996   Right Breast Cancer   Allergies  Allergen Reactions   Simvastatin     REACTION: nausea   Shrimp [Shellfish Allergy] Rash   Social History   Socioeconomic History   Marital status: Married    Spouse name: Not on file   Number of children: Not on file   Years of education: Not on file   Highest education level: Not on file  Occupational History   Not on file  Tobacco Use   Smoking status: Never   Smokeless tobacco: Never  Substance and Sexual Activity   Alcohol use: Yes    Comment: very rare   Drug use: No   Sexual activity: Not on file  Other Topics Concern   Not on file  Social History Narrative   Married 1969. 2 sons Michele Conley in [place] and [place] in [place]. 5 grandkids, 1 greatgranddaughter, 1 great step grandson.       Retired from 20 years with [employer]      Hobbies: travel/sightseeing, time at [employer], mountains with friends. [employer] in [place]   Social Drivers of Health   Financial Resource Strain: Low Risk  (07/24/2021)   Overall Financial Resource Strain (CARDIA)    Difficulty of Paying Living Expenses: Not hard at all  Food Insecurity: No Food Insecurity (07/24/2021)   Hunger Vital Sign    Worried About Running Out of Food in the Last Year: Never true    Ran Out of Food in the Last Year: Never true  Transportation Needs: No Transportation Needs (07/24/2021)   PRAPARE - Administrator, Civil Service (Medical): No    Lack of Transportation (Non-Medical): No  Physical  Activity: Inactive (07/24/2021)   Exercise Vital Sign    Days of Exercise per Week: 0 days    Minutes of Exercise per Session: 0 min  Stress: No Stress Concern Present (07/24/2021)   [employer] of Occupational Health - Occupational Stress Questionnaire    Feeling of Stress : Not at all  Social Connections: Socially Isolated (07/24/2021)   Social Connection and Isolation Panel [NHANES]    Frequency of Communication with Friends and Family: More than three times a week    Frequency of Social Gatherings with Friends and Family: More than three times a week    Attends Religious Services:  Never    Active Member of Clubs or Organizations: No    Attends Banker Meetings: Never    Marital Status: Widowed   Vitals:   07/11/23 1044 07/11/23 1106  BP: (!) 148/100 (!) 180/100  Pulse: 73   Resp: 16   Temp: 97.7 F (36.5 C)   SpO2: 96%    Body mass index is 25.1 kg/m.  Physical Exam Vitals and nursing note reviewed.  Constitutional:      General: She is not in acute distress.    Appearance: She is well-developed.  HENT:     Head: Normocephalic and atraumatic.     Mouth/Throat:     Mouth: Mucous membranes are moist.     Pharynx: Oropharynx is clear. Uvula midline.  Eyes:     Conjunctiva/sclera: Conjunctivae normal.  Cardiovascular:     Rate and Rhythm: Normal rate and regular rhythm.     Pulses:          Dorsalis pedis pulses are 2+ on the right side and 2+ on the left side.     Heart sounds: No murmur heard. Pulmonary:     Effort: Pulmonary effort is normal. No respiratory distress.     Breath sounds: Normal breath sounds.  Abdominal:     Palpations: Abdomen is soft. There is no hepatomegaly or mass.     Tenderness: There is no abdominal tenderness.  Musculoskeletal:     Lumbar back: No tenderness or bony tenderness. Negative right straight leg raise test and negative left straight leg raise test.     Right lower leg: No edema.     Left lower leg: No edema.      Comments: Heberden's node (DIP) and Bouchard's nodes (PIP). No signs of synovitis.  Lymphadenopathy:     Cervical: No cervical adenopathy.  Skin:    General: Skin is warm.     Findings: No erythema or rash.  Neurological:     General: No focal deficit present.     Mental Status: She is alert and oriented to person, place, and time.     Cranial Nerves: No cranial nerve deficit.     Gait: Gait normal.  Psychiatric:        Mood and Affect: Affect normal. Mood is anxious.   ASSESSMENT AND PLAN:  Ms. Hafen was seen today for chronic disease management.   Orders Placed This Encounter  Procedures   DG Lumbar Spine Complete   Basic metabolic panel   Microalbumin / creatinine urine ratio   VITAMIN D 25 Hydroxy (Vit-D Deficiency, Fractures)   CBC   TSH   Vitamin B12   Lab Results  Component Value Date   VITAMINB12 327 07/11/2023   Lab Results  Component Value Date   WBC 8.5 07/11/2023   HGB 14.9 07/11/2023   HCT 45.1 07/11/2023   MCV 91.2 07/11/2023   PLT 301.0 07/11/2023   Lab Results  Component Value Date   VD25OH 30.61 07/11/2023   Lab Results  Component Value Date   TSH 2.02 07/11/2023   Lab Results  Component Value Date   NA 140 07/11/2023   CL 102 07/11/2023   K 4.0 07/11/2023   CO2 27 07/11/2023   BUN 12 07/11/2023   CREATININE 0.86 07/11/2023   GFR 64.84 07/11/2023   CALCIUM 9.7 07/11/2023   ALBUMIN 4.2 01/10/2023   GLUCOSE 96 07/11/2023   Lab Results  Component Value Date   MICROALBUR 8.6 (H) 07/11/2023   MICROALBUR 4.3 (H) 12/11/2021  Hyperlipidemia, unspecified hyperlipidemia type Assessment & Plan: She has not been consistent with taking atorvastatin 20 mg, so prefers to hold on fasting lipid panel. Educated about the importance of taking her meds daily as instructed.  Vitamin D deficiency, unspecified Assessment & Plan: She has not been consistent with taking vitamin D supplementation. Further recommendation will be given according to 25  OH vitamin D result.  Orders: -     VITAMIN D 25 Hydroxy (Vit-D Deficiency, Fractures); Future  Stage 3a chronic kidney disease (HCC) Assessment & Plan: Renal function stable otherwise, for the past few years e GFR has been > 60. Continue low salt diet,adequate hydration, and avoidance of NSAIDs. Stressed the importance of good BP controlled and avoidance of NSAID's.  Orders: -     Basic metabolic panel; Future -     Microalbumin / creatinine urine ratio; Future -     VITAMIN D 25 Hydroxy (Vit-D Deficiency, Fractures); Future  Essential hypertension Assessment & Plan: She is not monitoring BP at home. She acknowledges she has not been taking her antihypertensive medications as instructed, she is missing days. For now she will continue amlodipine 10 mg daily, HCTZ 25 mg daily, lisinopril 40 mg daily. We discussed some complications of elevated BP. Instructed to monitor BP daily and to let me know about readings in 14 days. Instructed about warning signs. Follow-up in 2 months.  Orders: -     TSH; Future  Bilateral low back pain, unspecified chronicity, unspecified whether sciatica present No hx of trauma. We discussed possible etiologies, including muscle strain and degenerative disease. Continue Tylenol 500 mg 2-3 times per day and can try topical icy hot. Further recommendations according to lab/imaging result. We can consider PT if persistent.  -     DG Lumbar Spine Complete; Future -     CBC; Future  Burning sensation of skin Localized to subungual areas, both hands, no associated symptoms. Possible causes discussed. Monitor for new symptoms. Further recommendations according to lab results.  -     TSH; Future -     Vitamin B12; Future  Return in about 2 months (around 09/10/2023) for HTN and back pain.  I, Rolla Etienne Wierda, acting as a scribe for Nuvia Hileman [place], MD., have documented all relevant documentation on the behalf of Fleda Pagel [place], MD, as directed by  Mollye Guinta  [place], MD while in the presence of Haydin Calandra [place], MD.   I, Tyreke Kaeser [place], MD, have reviewed all documentation for this visit. The documentation on 07/11/23 for the exam, diagnosis, procedures, and orders are all accurate and complete.  Danecia Underdown G. [place], MD  [facility]. [place] office.

## 2023-07-25 ENCOUNTER — Encounter: Payer: Self-pay | Admitting: Family Medicine

## 2023-09-10 ENCOUNTER — Ambulatory Visit: Admitting: Family Medicine
